# Patient Record
Sex: Female | Born: 2005
Health system: Southern US, Community
[De-identification: ages and names within clinical notes are randomized; demographics above are authoritative.]

## PROBLEM LIST (undated history)

## (undated) DIAGNOSIS — Z8709 Personal history of other diseases of the respiratory system: Secondary | ICD-10-CM

## (undated) DIAGNOSIS — K121 Other forms of stomatitis: Secondary | ICD-10-CM

## (undated) DIAGNOSIS — N390 Urinary tract infection, site not specified: Secondary | ICD-10-CM

## (undated) DIAGNOSIS — T7840XA Allergy, unspecified, initial encounter: Secondary | ICD-10-CM

## (undated) DIAGNOSIS — B070 Plantar wart: Secondary | ICD-10-CM

## (undated) HISTORY — DX: Other forms of stomatitis: K12.1

## (undated) HISTORY — PX: LACRIMAL TUBE INSERTION: SHX1905

## (undated) HISTORY — DX: Urinary tract infection, site not specified: N39.0

## (undated) HISTORY — DX: Allergy, unspecified, initial encounter: T78.40XA

## (undated) HISTORY — DX: Personal history of other diseases of the respiratory system: Z87.09

## (undated) HISTORY — DX: Plantar wart: B07.0

---
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
## 2024-01-04 ENCOUNTER — Other Ambulatory Visit (HOSPITAL_COMMUNITY): Payer: Self-pay

## 2024-02-07 ENCOUNTER — Other Ambulatory Visit: Payer: Self-pay | Admitting: Pediatrics

## 2024-02-07 NOTE — Progress Notes (Signed)
 Letter written for college

## 2024-02-21 ENCOUNTER — Other Ambulatory Visit: Payer: Self-pay

## 2024-02-21 ENCOUNTER — Other Ambulatory Visit: Payer: Self-pay | Admitting: Pediatrics

## 2024-02-22 ENCOUNTER — Other Ambulatory Visit (HOSPITAL_COMMUNITY): Payer: Self-pay

## 2024-02-22 MED ORDER — SERTRALINE HCL 100 MG PO TABS
100.0000 mg | ORAL_TABLET | Freq: Every day | ORAL | 2 refills | Status: AC
  Filled 2024-02-22 – 2024-03-09 (×2): qty 90, 90d supply, fill #0

## 2024-02-23 ENCOUNTER — Encounter (HOSPITAL_BASED_OUTPATIENT_CLINIC_OR_DEPARTMENT_OTHER): Payer: Self-pay

## 2024-02-23 ENCOUNTER — Other Ambulatory Visit: Payer: Self-pay

## 2024-02-23 DIAGNOSIS — Z882 Allergy status to sulfonamides status: Secondary | ICD-10-CM

## 2024-02-23 DIAGNOSIS — Z79899 Other long term (current) drug therapy: Secondary | ICD-10-CM

## 2024-02-23 DIAGNOSIS — K859 Acute pancreatitis without necrosis or infection, unspecified: Principal | ICD-10-CM | POA: Diagnosis present

## 2024-02-23 DIAGNOSIS — Z818 Family history of other mental and behavioral disorders: Secondary | ICD-10-CM

## 2024-02-23 DIAGNOSIS — Z833 Family history of diabetes mellitus: Secondary | ICD-10-CM

## 2024-02-23 DIAGNOSIS — Z793 Long term (current) use of hormonal contraceptives: Secondary | ICD-10-CM

## 2024-02-23 DIAGNOSIS — K219 Gastro-esophageal reflux disease without esophagitis: Secondary | ICD-10-CM | POA: Diagnosis present

## 2024-02-23 DIAGNOSIS — D75839 Thrombocytosis, unspecified: Secondary | ICD-10-CM | POA: Diagnosis present

## 2024-02-23 DIAGNOSIS — E282 Polycystic ovarian syndrome: Secondary | ICD-10-CM | POA: Diagnosis present

## 2024-02-23 DIAGNOSIS — Z8249 Family history of ischemic heart disease and other diseases of the circulatory system: Secondary | ICD-10-CM

## 2024-02-23 DIAGNOSIS — E781 Pure hyperglyceridemia: Secondary | ICD-10-CM | POA: Diagnosis present

## 2024-02-23 DIAGNOSIS — R6510 Systemic inflammatory response syndrome (SIRS) of non-infectious origin without acute organ dysfunction: Secondary | ICD-10-CM | POA: Diagnosis present

## 2024-02-23 LAB — URINALYSIS, ROUTINE W REFLEX MICROSCOPIC
Bilirubin Urine: NEGATIVE
Glucose, UA: NEGATIVE mg/dL
Ketones, ur: NEGATIVE mg/dL
Leukocytes,Ua: NEGATIVE
Nitrite: NEGATIVE
Specific Gravity, Urine: 1.027 (ref 1.005–1.030)
pH: 6.5 (ref 5.0–8.0)

## 2024-02-23 LAB — PREGNANCY, URINE: Preg Test, Ur: NEGATIVE

## 2024-02-23 NOTE — ED Triage Notes (Signed)
 Pt c/o abd pain, NV onset "earlier today, kept getting worse. Thought it was trapped gas or something- took gas pills/ pepto pills." L side/ flank pain.  Pt w familial hx of kidney stones

## 2024-02-24 ENCOUNTER — Emergency Department (HOSPITAL_BASED_OUTPATIENT_CLINIC_OR_DEPARTMENT_OTHER)

## 2024-02-24 ENCOUNTER — Inpatient Hospital Stay (HOSPITAL_BASED_OUTPATIENT_CLINIC_OR_DEPARTMENT_OTHER)
Admission: EM | Admit: 2024-02-24 | Discharge: 2024-02-28 | DRG: 439 | Disposition: A | Discharge status: Home / Self Care | Attending: Internal Medicine | Admitting: Internal Medicine

## 2024-02-24 DIAGNOSIS — R109 Unspecified abdominal pain: Secondary | ICD-10-CM | POA: Diagnosis not present

## 2024-02-24 DIAGNOSIS — K859 Acute pancreatitis without necrosis or infection, unspecified: Secondary | ICD-10-CM

## 2024-02-24 HISTORY — DX: Acute pancreatitis without necrosis or infection, unspecified: K85.90

## 2024-02-24 LAB — COMPREHENSIVE METABOLIC PANEL WITH GFR
ALT: 19 U/L (ref 0–44)
AST: 19 U/L (ref 15–41)
Albumin: 4.1 g/dL (ref 3.5–5.0)
Alkaline Phosphatase: 90 U/L (ref 38–126)
BUN: 7 mg/dL (ref 6–20)
CO2: <7 mmol/L — ABNORMAL LOW (ref 22–32)
Calcium: 9.6 mg/dL (ref 8.9–10.3)
Chloride: 96 mmol/L — ABNORMAL LOW (ref 98–111)
Creatinine, Ser: 0.62 mg/dL (ref 0.44–1.00)
GFR, Estimated: >60 mL/min (ref >60)
Glucose, Bld: 115 mg/dL — ABNORMAL HIGH (ref 70–99)
Potassium: 4.1 mmol/L (ref 3.5–5.1)
Sodium: 133 mmol/L — ABNORMAL LOW (ref 135–145)
Total Bilirubin: <0.2 mg/dL (ref 0.0–1.2)
Total Protein: 8 g/dL (ref 6.5–8.1)

## 2024-02-24 LAB — LACTIC ACID, PLASMA
Lactic Acid, Venous: 1.6 mmol/L (ref 0.5–1.9)
Lactic Acid, Venous: 1.1 mmol/L (ref 0.5–1.9)

## 2024-02-24 LAB — I-STAT VENOUS BLOOD GAS, ED
Acid-base deficit: 3 mmol/L — ABNORMAL HIGH (ref 0.0–2.0)
Bicarbonate: 22.6 mmol/L (ref 20.0–28.0)
Calcium, Ion: 1.17 mmol/L (ref 1.15–1.40)
HCT: 31 % — ABNORMAL LOW (ref 36.0–46.0)
Hemoglobin: 10.5 g/dL — ABNORMAL LOW (ref 12.0–15.0)
O2 Saturation: 54 %
Potassium: 4 mmol/L (ref 3.5–5.1)
Sodium: 136 mmol/L (ref 135–145)
TCO2: 24 mmol/L (ref 22–32)
pCO2, Ven: 40.8 mmHg — ABNORMAL LOW (ref 44–60)
pH, Ven: 7.352 (ref 7.25–7.43)
pO2, Ven: 30 mmHg — CL (ref 32–45)

## 2024-02-24 LAB — CBC
HCT: 35.2 % — ABNORMAL LOW (ref 36.0–46.0)
HCT: 38.5 % (ref 36.0–46.0)
Hemoglobin: 12.1 g/dL (ref 12.0–15.0)
Hemoglobin: 13.1 g/dL (ref 12.0–15.0)
MCH: 28.4 pg (ref 26.0–34.0)
MCH: 28.8 pg (ref 26.0–34.0)
MCHC: 34 g/dL (ref 30.0–36.0)
MCHC: 34.4 g/dL (ref 30.0–36.0)
MCV: 83.5 fL (ref 80.0–100.0)
MCV: 83.8 fL (ref 80.0–100.0)
Platelets: 379 10*3/uL (ref 150–400)
Platelets: 430 10*3/uL — ABNORMAL HIGH (ref 150–400)
RBC: 4.2 MIL/uL (ref 3.87–5.11)
RBC: 4.61 MIL/uL (ref 3.87–5.11)
RDW: 12.6 % (ref 11.5–15.5)
RDW: 13.1 % (ref 11.5–15.5)
WBC: 18.2 10*3/uL — ABNORMAL HIGH (ref 4.0–10.5)
WBC: 21.4 10*3/uL — ABNORMAL HIGH (ref 4.0–10.5)
nRBC: 0 % (ref 0.0–0.2)
nRBC: 0 % (ref 0.0–0.2)

## 2024-02-24 LAB — HIV ANTIBODY (ROUTINE TESTING W REFLEX): HIV Screen 4th Generation wRfx: NONREACTIVE

## 2024-02-24 LAB — BASIC METABOLIC PANEL WITH GFR
Anion gap: 15 (ref 5–15)
BUN: 5 mg/dL — ABNORMAL LOW (ref 6–20)
CO2: 17 mmol/L — ABNORMAL LOW (ref 22–32)
Calcium: 7.9 mg/dL — ABNORMAL LOW (ref 8.9–10.3)
Chloride: 104 mmol/L (ref 98–111)
Creatinine, Ser: 0.42 mg/dL — ABNORMAL LOW (ref 0.44–1.00)
GFR, Estimated: >60 mL/min (ref >60)
Glucose, Bld: 107 mg/dL — ABNORMAL HIGH (ref 70–99)
Potassium: 3.6 mmol/L (ref 3.5–5.1)
Sodium: 135 mmol/L (ref 135–145)

## 2024-02-24 LAB — LIPID PANEL
Cholesterol: 243 mg/dL — ABNORMAL HIGH (ref 0–200)
HDL: 48 mg/dL (ref >40)
LDL Cholesterol: 117 mg/dL — ABNORMAL HIGH (ref 0–99)
Total CHOL/HDL Ratio: 5.1 ratio
Triglycerides: 391 mg/dL — ABNORMAL HIGH (ref <150)
VLDL: 78 mg/dL — ABNORMAL HIGH (ref 0–40)

## 2024-02-24 LAB — CREATININE, SERUM
Creatinine, Ser: 0.48 mg/dL (ref 0.44–1.00)
GFR, Estimated: >60 mL/min (ref >60)

## 2024-02-24 LAB — LIPASE, BLOOD: Lipase: 138 U/L — ABNORMAL HIGH (ref 11–51)

## 2024-02-24 LAB — HEMOGLOBIN A1C
Hgb A1c MFr Bld: 4.6 % — ABNORMAL LOW (ref 4.8–5.6)
Mean Plasma Glucose: 85.32 mg/dL

## 2024-02-24 MED ORDER — PROMETHAZINE HCL 25 MG/ML IJ SOLN
INTRAMUSCULAR | Status: AC
  Filled 2024-02-24: qty 1

## 2024-02-24 MED ORDER — KETOROLAC TROMETHAMINE 15 MG/ML IJ SOLN
15.0000 mg | Freq: Four times a day (QID) | INTRAMUSCULAR | Status: DC | PRN
  Administered 2024-02-24: 15 mg via INTRAVENOUS
  Filled 2024-02-24: qty 1

## 2024-02-24 MED ORDER — EPINEPHRINE 0.3 MG/0.3ML IJ SOAJ: 0.3000 mg | INTRAMUSCULAR | Status: DC | PRN

## 2024-02-24 MED ORDER — ALBUTEROL SULFATE (2.5 MG/3ML) 0.083% IN NEBU: 3.0000 mL | INHALATION_SOLUTION | Freq: Four times a day (QID) | RESPIRATORY_TRACT | Status: DC | PRN

## 2024-02-24 MED ORDER — HYDROMORPHONE HCL 1 MG/ML IJ SOLN
1.0000 mg | Freq: Once | INTRAMUSCULAR | Status: AC
  Administered 2024-02-24: 1 mg via INTRAVENOUS
  Filled 2024-02-24: qty 1

## 2024-02-24 MED ORDER — FENTANYL CITRATE PF 50 MCG/ML IJ SOSY
50.0000 ug | PREFILLED_SYRINGE | INTRAMUSCULAR | Status: AC | PRN
  Administered 2024-02-24 (×2): 50 ug via INTRAVENOUS
  Filled 2024-02-24 (×2): qty 1

## 2024-02-24 MED ORDER — SENNOSIDES-DOCUSATE SODIUM 8.6-50 MG PO TABS
1.0000 | ORAL_TABLET | Freq: Every evening | ORAL | Status: DC | PRN
  Administered 2024-02-24 – 2024-02-25 (×2): 1 via ORAL
  Filled 2024-02-24 (×2): qty 1

## 2024-02-24 MED ORDER — MORPHINE SULFATE (PF) 4 MG/ML IV SOLN
4.0000 mg | Freq: Once | INTRAVENOUS | Status: AC
  Administered 2024-02-24: 4 mg via INTRAVENOUS
  Filled 2024-02-24: qty 1

## 2024-02-24 MED ORDER — ONDANSETRON HCL 4 MG/2ML IJ SOLN
4.0000 mg | Freq: Once | INTRAMUSCULAR | Status: AC
  Administered 2024-02-24: 4 mg via INTRAVENOUS
  Filled 2024-02-24: qty 2

## 2024-02-24 MED ORDER — ACETAMINOPHEN 650 MG RE SUPP: 650.0000 mg | Freq: Four times a day (QID) | RECTAL | Status: DC | PRN

## 2024-02-24 MED ORDER — SODIUM CHLORIDE 0.9 % IV BOLUS
1000.0000 mL | Freq: Once | INTRAVENOUS | Status: AC
  Administered 2024-02-24: 1000 mL via INTRAVENOUS

## 2024-02-24 MED ORDER — HYDROCODONE-ACETAMINOPHEN 5-325 MG PO TABS
1.0000 | ORAL_TABLET | Freq: Four times a day (QID) | ORAL | Status: DC | PRN
  Administered 2024-02-24: 1 via ORAL
  Administered 2024-02-25 (×2): 2 via ORAL
  Administered 2024-02-25: 1 via ORAL
  Administered 2024-02-26 (×2): 2 via ORAL
  Administered 2024-02-26: 1 via ORAL
  Administered 2024-02-28: 2 via ORAL
  Filled 2024-02-24 (×3): qty 2
  Filled 2024-02-24: qty 1
  Filled 2024-02-24 (×3): qty 2
  Filled 2024-02-24: qty 1

## 2024-02-24 MED ORDER — HYDROMORPHONE HCL 1 MG/ML IJ SOLN
1.0000 mg | INTRAMUSCULAR | Status: DC | PRN
  Administered 2024-02-25 – 2024-02-27 (×8): 1 mg via INTRAVENOUS
  Filled 2024-02-24 (×9): qty 1

## 2024-02-24 MED ORDER — LACTATED RINGERS IV SOLN: INTRAVENOUS | Status: DC

## 2024-02-24 MED ORDER — IOHEXOL 300 MG/ML  SOLN
100.0000 mL | Freq: Once | INTRAMUSCULAR | Status: AC | PRN
  Administered 2024-02-24: 100 mL via INTRAVENOUS

## 2024-02-24 MED ORDER — MORPHINE SULFATE (PF) 2 MG/ML IV SOLN
2.0000 mg | INTRAVENOUS | Status: DC | PRN
  Administered 2024-02-24 (×2): 2 mg via INTRAVENOUS
  Filled 2024-02-24 (×2): qty 1

## 2024-02-24 MED ORDER — ACETAMINOPHEN 325 MG PO TABS
650.0000 mg | ORAL_TABLET | Freq: Four times a day (QID) | ORAL | Status: DC | PRN
  Filled 2024-02-24: qty 2

## 2024-02-24 MED ORDER — SODIUM CHLORIDE 0.9 % IV SOLN
12.5000 mg | Freq: Four times a day (QID) | INTRAVENOUS | Status: DC | PRN
  Administered 2024-02-24 – 2024-02-27 (×6): 12.5 mg via INTRAVENOUS
  Filled 2024-02-24 (×2): qty 0.5
  Filled 2024-02-24 (×2): qty 12.5
  Filled 2024-02-24: qty 0.5
  Filled 2024-02-24: qty 12.5

## 2024-02-24 MED ORDER — ENOXAPARIN SODIUM 40 MG/0.4ML IJ SOSY
40.0000 mg | PREFILLED_SYRINGE | INTRAMUSCULAR | Status: DC
  Administered 2024-02-24 – 2024-02-26 (×3): 40 mg via SUBCUTANEOUS
  Filled 2024-02-24 (×3): qty 0.4

## 2024-02-24 MED ORDER — ONDANSETRON HCL 4 MG/2ML IJ SOLN
4.0000 mg | Freq: Three times a day (TID) | INTRAMUSCULAR | Status: DC | PRN
  Administered 2024-02-24 – 2024-02-27 (×6): 4 mg via INTRAVENOUS
  Filled 2024-02-24 (×9): qty 2

## 2024-02-24 MED ORDER — HYDROCODONE-ACETAMINOPHEN 5-325 MG PO TABS: 1.0000 | ORAL_TABLET | ORAL | Status: DC | PRN

## 2024-02-24 NOTE — ED Notes (Signed)
 [facility] at bedside to transport pt to [facility]

## 2024-02-24 NOTE — H&P (Signed)
 [facility] H&P   Patient Demographics:    Judy Hughes, is a 18 y.o. female  MRN: [id number]   DOB - 11/30/2005  Admit Date - 02/24/2024  Outpatient Primary MD for the patient is Klett, Freya Jesus, NP  Patient coming from: [facility]  Chief Complaint  Patient presents with   Abdominal Pain   Flank Pain    L      HPI:    Judy Hughes  is a 18 y.o. female, with history of PCOS, seasonal allergies, UTIs, migraine, lipidemia for which she has not taken a medication in few years comes to the hospital after experiencing sharp epigastric abdominal pain which started abruptly yesterday at home, pain is radiating to her back and associated with nausea and vomiting, worse with eating food better with bowel rest and pain medications, went to [facility] where workup was suggestive of acute pancreatitis and she was sent to [facility] for further admission.  Her LFTs were stable, she had reactive leukocytosis, CT scan confirmed pancreatitis with no CBD dilation or gallstones.  Patient denies any previous history of pancreatitis, occasional minimal amounts of alcohol intake, she was diagnosed with cholesterol but has not taken a medication for it for 2 years, she still has her gallbladder but no history of gallstones, no exposure to weight loss medications, one of her cousins sisters also had pancreatitis a few months ago, she denies any fever or chills, no headache, no chest pain, no blood in stool or urine, no dysuria, no focal weakness.    Review of systems:     A full 10 point Review of Systems was done, except as stated above, all other Review of Systems were negative.   With Past History of the following :    Past Medical History:   Diagnosis Date   Allergy    Gastroesophageal reflux disease without esophagitis 12/15/2016   History of frequent upper respiratory infection 2017   Migraine without aura and without status migrainosus, not intractable 07/02/2020   Migraine, unspecified, not intractable, without status migrainosus 08/20/2021   Mouth ulcer    PCOS (polycystic ovarian syndrome) 02/04/2020   Plantar wart    Positive RAST testing 08/02/2012   Urinary tract infection    x3 as of 6/12   Verruca vulgaris 05/30/2020      Past Surgical History:  Procedure Laterality Date   LACRIMAL TUBE INSERTION        Social History:     Social History   Tobacco Use   Smoking status: Never    Passive exposure: Current   Smokeless tobacco: Never   Tobacco comments:    second hand smoke exposure  Substance Use Topics   Alcohol use: No      Family History :     Family History  Problem Relation Age of Onset   Other Mother        +anticardiolipin antibody   Migraines Mother    Cholelithiasis Father    Depression Sister    Depression Brother    Bipolar disorder Brother    Other Brother        chronic back pain   Diabetes Maternal Aunt    Diabetes Paternal Uncle    Heart disease Maternal Grandmother        MI, CAD, died at 72, new MI   Cancer Maternal Grandfather 71       colorectal   Diabetes Maternal Grandfather    Celiac disease Neg Hx    Stroke Neg Hx        Home Medications:   Prior to Admission medications   Medication Sig Start Date End Date Taking? Authorizing Provider  albuterol  (VENTOLIN  HFA) 108 (90 Base) MCG/ACT inhaler Inhale 2 puffs into the lungs every 6 (six) hours as needed for wheezing or shortness of breath. Patient not taking: Reported on 09/27/2023 12/31/22   Tysinger, Christiane Cowing, PA-C  amphetamine -dextroamphetamine  (ADDERALL XR) 10 MG 24 hr capsule Take 1 capsule (10 mg total) by mouth daily. 06/10/23 09/12/23  Rayann Cage, NP  Atogepant  (QULIPTA ) 60 MG TABS Take 1 tablet (60  mg total) by mouth daily. 02/27/23   Glory Larsen, MD  benzonatate  (TESSALON ) 100 MG capsule Take 1-2 caps PO TID PRN 10/08/23   Mayers, Cari S, PA-C  cetirizine  (ZYRTEC ) 10 MG chewable tablet Chew 10 mg by mouth daily.    [provider]  desogestrel -ethinyl estradiol  (ISIBLOOM ) 0.15-30 MG-MCG tablet Take 1 tablet by mouth daily. (continuous) 10/03/23 03/29/24  Klett, Freya Jesus, NP  EPINEPHrine  0.3 mg/0.3 mL IJ SOAJ injection Inject 0.3 mg into the muscle as needed for anaphylaxis. Patient not taking: Reported on 09/27/2023 05/11/23   Rayann Cage, NP  fluconazole  (DIFLUCAN ) 150 MG tablet Take 1 tablet (150 mg total) by mouth once if symptoms and can repeat 1 tablet in 1 week if needed 09/27/23   Tysinger, Christiane Cowing, PA-C  ibuprofen  (ADVIL ) 200 MG tablet Take 800 mg by mouth every 6 (six) hours as needed.    [provider]  montelukast  (SINGULAIR ) 10 MG tablet Take 1 tablet (10 mg total) by mouth at bedtime. Patient not taking: Reported on 09/27/2023 08/10/22   Crain, Whitney L, PA  Multiple Vitamin (MULTIVITAMIN PO) Take by mouth.    [provider]  ondansetron  (ZOFRAN -ODT) 4 MG disintegrating tablet Dissolve 1 tablet (4 mg total) in mouth every 8 (eight) hours as needed for nausea or vomiting. 11/14/23   Klett, Freya Jesus, NP  rizatriptan  (MAXALT -MLT) 10 MG disintegrating tablet Take 1 tablet (10 mg total) by mouth as needed for migraine. May repeat in 2 hours if needed Patient not taking: Reported on 09/27/2023 02/24/23   Glory Larsen, MD  sertraline  (ZOLOFT ) 100 MG tablet Take 1 tablet (100 mg total) by mouth daily. 02/22/24   Rayann Cage, NP  Allergies:     Allergies  Allergen Reactions   Sulfa Antibiotics Rash     Physical Exam:   Vitals  Blood pressure 133/86, pulse (!) 114, temperature 98.5 F (36.9 C), temperature source Oral, resp. rate 15, SpO2 99%.   1. General -Young, obese Caucasian girl lying in hospital bed in mild distress due to abdominal pain,  2.  Normal affect and insight, Not Suicidal or Homicidal, Awake Alert,   3. No F.N deficits, ALL C.Nerves Intact, Strength 5/5 all 4 extremities, Sensation intact all 4 extremities, Plantars down going.  4. Ears and Eyes appear Normal, Conjunctivae clear, PERRLA. Moist Oral Mucosa.  5. Supple Neck, No JVD, No cervical lymphadenopathy appriciated, No Carotid Bruits.  6. Symmetrical Chest wall movement, Good air movement bilaterally, CTAB.  7. RRR, No Gallops, Rubs or Murmurs, No Parasternal Heave.  8. Positive Bowel Sounds, Abdomen Soft, positive epigastric tenderness, No organomegaly appriciated,No rebound -guarding or rigidity.  9.  No Cyanosis, Normal Skin Turgor, No Skin Rash or Bruise.  10. Good muscle tone,  joints appear normal , no effusions, Normal ROM.  11. No Palpable Lymph Nodes in Neck or Axillae      Data Review:   Recent Labs  Lab 02/24/24 0040 02/24/24 0515  WBC 21.4*  --   HGB 13.1 10.5*  HCT 38.5 31.0*  PLT 430*  --   MCV 83.5  --   MCH 28.4  --   MCHC 34.0  --   RDW 12.6  --     Recent Labs  Lab 02/24/24 0040 02/24/24 0446 02/24/24 0515 02/24/24 0628  NA 133*  --  136 135  K 4.1  --  4.0 3.6  CL 96*  --   --  104  CO2 <7*  --   --  17*  ANIONGAP NOT CALCULATED  --   --  15  GLUCOSE 115*  --   --  107*  BUN 7  --   --  5*  CREATININE 0.62  --   --  0.42*  AST 19  --   --   --   ALT 19  --   --   --   ALKPHOS 90  --   --   --   BILITOT <0.2  --   --   --   ALBUMIN 4.1  --   --   --   LATICACIDVEN  --  1.6  --  1.1  CALCIUM 9.6  --   --  7.9*    Lab Results  Component Value Date   CHOL 184 (H) 04/20/2021   HDL 62 04/20/2021   LDLCALC 81 04/20/2021   TRIG 343 (H) 04/20/2021   CHOLHDL 3.0 04/20/2021    Recent Labs  Lab 02/24/24 0040 02/24/24 0446 02/24/24 0628  LATICACIDVEN  --  1.6 1.1  CALCIUM 9.6  --  7.9*    Recent Labs  Lab 02/24/24 0040 02/24/24 0446 02/24/24 0628  WBC 21.4*  --   --   PLT 430*  --   --    LATICACIDVEN  --  1.6 1.1  CREATININE 0.62  --  0.42*    Urinalysis    Component Value Date/Time   COLORURINE YELLOW 02/23/2024 2150   APPEARANCEUR CLEAR 02/23/2024 2150   LABSPEC 1.027 02/23/2024 2150   LABSPEC 1.020 04/19/2017 0959   PHURINE 6.5 02/23/2024 2150   GLUCOSEU NEGATIVE 02/23/2024 2150   HGBUR TRACE (A) 02/23/2024 2150   BILIRUBINUR NEGATIVE 02/23/2024 2150  BILIRUBINUR negative 04/19/2017 0959   BILIRUBINUR n 04/15/2016 1121   KETONESUR NEGATIVE 02/23/2024 2150   PROTEINUR TRACE (A) 02/23/2024 2150   UROBILINOGEN negative 04/15/2016 1121   UROBILINOGEN 0.2 07/10/2012 1459   NITRITE NEGATIVE 02/23/2024 2150   LEUKOCYTESUR NEGATIVE 02/23/2024 2150      Imaging Results:    CT ABDOMEN PELVIS W CONTRAST Result Date: 02/24/2024 EXAM: CT ABDOMEN AND PELVIS WITH CONTRAST 02/24/2024 01:41:04 AM TECHNIQUE: CT of the abdomen and pelvis was performed with the administration of intravenous contrast. Multiplanar reformatted images are provided for review. Automated exposure control, iterative reconstruction, and/or weight based adjustment of the mA/kV was utilized to reduce the radiation dose to as low as reasonably achievable. COMPARISON: None available. CLINICAL HISTORY: Abdominal pain, acute, nonlocalized. Pt c/o abd pain, NV onset "earlier today, kept getting worse. Thought it was trapped gas or something- took gas pills/ pepto pills." L side/ flank pain. Pt w familial hx of kidney stones. FINDINGS: LOWER CHEST: No acute abnormality. LIVER: The liver is unremarkable. GALLBLADDER AND BILE DUCTS: Gallbladder is unremarkable. No biliary ductal dilatation. SPLEEN: No acute abnormality. PANCREAS: Peripancreatic fluid/inflammatory changes along the pancreatic tail image 27, suggesting acute pancreatitis. No walled-off fluid collection/pseudocyst. ADRENAL GLANDS: No acute abnormality. KIDNEYS, URETERS AND BLADDER: No stones in the kidneys or ureters. No hydronephrosis. No perinephric or  periureteral stranding. Urinary bladder is unremarkable. GI AND BOWEL: Stomach demonstrates no acute abnormality. There is no bowel obstruction. No appendicitis. Normal appendix (image 70). PERITONEUM AND RETROPERITONEUM: No ascites. No free air. VASCULATURE: Aorta is normal in caliber. LYMPH NODES: No lymphadenopathy. REPRODUCTIVE ORGANS: No acute abnormality. BONES AND SOFT TISSUES: No acute osseous abnormality. No focal soft tissue abnormality. IMPRESSION: 1. Acute pancreatitis, as above. No walled-off fluid collection/pseudocyst. Electronically signed by: Zadie Herter MD 02/24/2024 01:44 AM EDT RP Workstation: [id number]     Assessment & Plan:   Acute pancreatitis.  Unclear etiology, LFTs stable, CBD looks unremarkable on CT, no history of alcohol abuse, does have history of high cholesterol hence we will check a lipid panel, and IgG4, for now admitted with bowel rest, IV fluids and pain control.   She does take oral contraceptive pills and could be a potential trigger.  2.  Leukocytosis.  Reactive.  Monitor.  3.  History of allergies.  Supportive care.  4.  Dyslipidemia.  Check lipid panel.  Does not take any medications.   Home medication list is wrong, according to the patient she is only taking 3 medications 1 of which is an oral contraceptive, pharmacy to update.   DVT Prophylaxis Lovenox  AM Labs Ordered, also please review Full Orders  Family Communication: Admission, patients condition and plan of care including tests being ordered have been discussed with the patient and mother who indicate understanding and agree with the plan and Code Status.  Code Status full code  Likely DC to home  Condition fair  Consults called: None  Admission status: Observation  Time spent in minutes : 45  Signature  -    Lynnwood Sauer M.D on 02/24/2024 at 4:46 PM   -  To page go to [email or web address]

## 2024-02-24 NOTE — ED Provider Notes (Signed)
 [place] EMERGENCY DEPARTMENT AT [facility] Provider Note   CSN: [id number] Arrival date & time: 02/23/24  2132     History  Chief Complaint  Patient presents with   Abdominal Pain   Flank Pain    L    Judy Hughes is a 18 y.o. female.  Patient is a 18 year old female presenting with complaints of abdominal pain.  She describes nausea and vomiting earlier today along with left-sided abdominal pain.  This seems to have worsened throughout the day.  No fevers or chills.  She denies any diarrhea or constipation.  No urinary complaints.  No aggravating or alleviating factors.       Home Medications Prior to Admission medications   Medication Sig Start Date End Date Taking? Authorizing Provider  albuterol  (VENTOLIN  HFA) 108 (90 Base) MCG/ACT inhaler Inhale 2 puffs into the lungs every 6 (six) hours as needed for wheezing or shortness of breath. Patient not taking: Reported on 09/27/2023 12/31/22   Tysinger, Christiane Cowing, PA-C  amphetamine -dextroamphetamine  (ADDERALL XR) 10 MG 24 hr capsule Take 1 capsule (10 mg total) by mouth daily. 06/10/23 09/12/23  Rayann Cage, NP  Atogepant  (QULIPTA ) 60 MG TABS Take 1 tablet (60 mg total) by mouth daily. 02/27/23   Glory Larsen, MD  benzonatate  (TESSALON ) 100 MG capsule Take 1-2 caps PO TID PRN 10/08/23   Mayers, Cari S, PA-C  cetirizine  (ZYRTEC ) 10 MG chewable tablet Chew 10 mg by mouth daily.    [provider]  desogestrel -ethinyl estradiol  (ISIBLOOM ) 0.15-30 MG-MCG tablet Take 1 tablet by mouth daily. (continuous) 10/03/23 03/29/24  Klett, Freya Jesus, NP  EPINEPHrine  0.3 mg/0.3 mL IJ SOAJ injection Inject 0.3 mg into the muscle as needed for anaphylaxis. Patient not taking: Reported on 09/27/2023 05/11/23   Rayann Cage, NP  fluconazole  (DIFLUCAN ) 150 MG tablet Take 1 tablet (150 mg total) by mouth once if symptoms and can repeat 1 tablet in 1 week if needed 09/27/23   Tysinger, Christiane Cowing, PA-C  ibuprofen  (ADVIL ) 200 MG tablet Take 800 mg  by mouth every 6 (six) hours as needed.    [provider]  montelukast  (SINGULAIR ) 10 MG tablet Take 1 tablet (10 mg total) by mouth at bedtime. Patient not taking: Reported on 09/27/2023 08/10/22   Crain, Whitney L, PA  Multiple Vitamin (MULTIVITAMIN PO) Take by mouth.    [provider]  ondansetron  (ZOFRAN -ODT) 4 MG disintegrating tablet Dissolve 1 tablet (4 mg total) in mouth every 8 (eight) hours as needed for nausea or vomiting. 11/14/23   Klett, Freya Jesus, NP  rizatriptan  (MAXALT -MLT) 10 MG disintegrating tablet Take 1 tablet (10 mg total) by mouth as needed for migraine. May repeat in 2 hours if needed Patient not taking: Reported on 09/27/2023 02/24/23   Glory Larsen, MD  sertraline  (ZOLOFT ) 100 MG tablet Take 1 tablet (100 mg total) by mouth daily. 02/22/24   Rayann Cage, NP      Allergies    Sulfa antibiotics    Review of Systems   Review of Systems  All other systems reviewed and are negative.   Physical Exam Updated Vital Signs BP 120/89   Pulse 92   Temp 98.6 F (37 C)   Resp 18   SpO2 98%  Physical Exam Vitals and nursing note reviewed.  Constitutional:      General: She is not in acute distress.    Appearance: She is well-developed. She is not diaphoretic.  HENT:  Head: Normocephalic and atraumatic.  Cardiovascular:     Rate and Rhythm: Normal rate and regular rhythm.     Heart sounds: No murmur heard.    No friction rub. No gallop.  Pulmonary:     Effort: Pulmonary effort is normal. No respiratory distress.     Breath sounds: Normal breath sounds. No wheezing.  Abdominal:     General: Bowel sounds are normal. There is no distension.     Palpations: Abdomen is soft.     Tenderness: There is generalized abdominal tenderness. There is no right CVA tenderness, left CVA tenderness, guarding or rebound.  Musculoskeletal:        General: Normal range of motion.     Cervical back: Normal range of motion and neck supple.  Skin:    General: Skin  is warm and dry.  Neurological:     General: No focal deficit present.     Mental Status: She is alert and oriented to person, place, and time.     ED Results / Procedures / Treatments   Labs (all labs ordered are listed, but only abnormal results are displayed) Labs Reviewed  URINALYSIS, ROUTINE W REFLEX MICROSCOPIC - Abnormal; Notable for the following components:      Result Value   Hgb urine dipstick TRACE (*)    Protein, ur TRACE (*)    Bacteria, UA RARE (*)    All other components within normal limits  CBC - Abnormal; Notable for the following components:   WBC 21.4 (*)    Platelets 430 (*)    All other components within normal limits  COMPREHENSIVE METABOLIC PANEL WITH GFR - Abnormal; Notable for the following components:   Sodium 133 (*)    Chloride 96 (*)    CO2 <7 (*)    Glucose, Bld 115 (*)    All other components within normal limits  PREGNANCY, URINE    EKG None  Radiology No results found.  Procedures Procedures    Medications Ordered in ED Medications  fentaNYL (SUBLIMAZE) injection 50 mcg (50 mcg Intravenous Given 02/24/24 0046)  sodium chloride 0.9 % bolus 1,000 mL (has no administration in time range)  iohexol (OMNIPAQUE) 300 MG/ML solution 100 mL (has no administration in time range)  ondansetron  (ZOFRAN ) injection 4 mg (4 mg Intravenous Given 02/24/24 0045)  morphine (PF) 4 MG/ML injection 4 mg (4 mg Intravenous Given 02/24/24 0127)  ondansetron  (ZOFRAN ) injection 4 mg (4 mg Intravenous Given 02/24/24 0126)    ED Course/ Medical Decision Making/ A&P  Patient is a 18 year old female presenting with left upper quadrant pain as described in the HPI.  Patient arrives here with stable vital signs and is afebrile.  Physical examination reveals left upper quadrant tenderness, but is otherwise unremarkable.  Laboratory studies obtained including CBC, CMP, and lipase.  She has a white count of 21.4 and lipase of 138.  She also has a CO2 less than 7, but normal  blood sugar.  Urinalysis is clear and pregnancy test is negative.  CT scan of the abdomen and pelvis obtained showing acute pancreatitis.  Patient has received multiple pain medications including fentanyl, morphine x 2, and Dilaudid as well as Zofran  and Phenergan  for nausea with minimal relief of her discomfort.  Due to the intractable nature of her discomfort and nausea, I feel as though admission is appropriate.  Care discussed with Dr. Michell Ahumada who agrees to admit.  Also of note is that a lactic acid was obtained and was normal.  A VBG was also obtained showing a nearly normal pH and PCO2 of 24.  I am uncertain as to the initial CO2 level less than 7, but suspect this may have been erroneous.  Final Clinical Impression(s) / ED Diagnoses Final diagnoses:  None    Rx / DC Orders ED Discharge Orders     None         Orvilla Blander, MD 02/24/24 [phone]

## 2024-02-24 NOTE — Plan of Care (Signed)
 Plan of Care Note for accepted transfer   Patient name: Judy Hughes ZOX:[id number] DOB: [birth year]  Facility requesting transfer: [facility] ED Requesting Provider: Dr. Lula Sale Facility course: 18 year old female with history of GERD, migraine headaches, PCOS, UTI presented with complaints of acute onset abdominal pain, nausea, and vomiting.  Labs showing WBC count 21.4, sodium 133, chloride 96, bicarb <7, lipase 138.  CT showing acute pancreatitis without walled off fluid collection/pseudocyst.  Patient was given morphine, Zofran , Phenergan , fentanyl, and 1 L IV fluids.  Requested ED physician to check lactate, blood gas, and order bicarb replacement.  Plan of care: The patient is accepted for admission to Progressive unit at [facility].  [facility] will assume care on arrival to accepting facility. Until arrival, care as per EDP. However, [facility] available 24/7 for questions and assistance.  Check [email or web address] for on-call coverage.  Nursing staff, please call [facility] Admits & Consults System-Wide number under Amion on patient's arrival so appropriate admitting provider can evaluate the pt.

## 2024-02-24 NOTE — ED Notes (Signed)
 Judy Hughes with cl called for transport

## 2024-02-24 NOTE — ED Notes (Signed)
 ED Provider at bedside.

## 2024-02-24 NOTE — ED Notes (Signed)
 Attempted to call report. Nurse to call back.

## 2024-02-25 ENCOUNTER — Inpatient Hospital Stay (HOSPITAL_COMMUNITY)

## 2024-02-25 DIAGNOSIS — K859 Acute pancreatitis without necrosis or infection, unspecified: Secondary | ICD-10-CM | POA: Diagnosis not present

## 2024-02-25 DIAGNOSIS — Z818 Family history of other mental and behavioral disorders: Secondary | ICD-10-CM | POA: Diagnosis not present

## 2024-02-25 DIAGNOSIS — Z8249 Family history of ischemic heart disease and other diseases of the circulatory system: Secondary | ICD-10-CM | POA: Diagnosis not present

## 2024-02-25 DIAGNOSIS — K219 Gastro-esophageal reflux disease without esophagitis: Secondary | ICD-10-CM | POA: Diagnosis not present

## 2024-02-25 DIAGNOSIS — Z833 Family history of diabetes mellitus: Secondary | ICD-10-CM | POA: Diagnosis not present

## 2024-02-25 DIAGNOSIS — R6510 Systemic inflammatory response syndrome (SIRS) of non-infectious origin without acute organ dysfunction: Secondary | ICD-10-CM

## 2024-02-25 DIAGNOSIS — R918 Other nonspecific abnormal finding of lung field: Secondary | ICD-10-CM | POA: Diagnosis not present

## 2024-02-25 DIAGNOSIS — J90 Pleural effusion, not elsewhere classified: Secondary | ICD-10-CM | POA: Diagnosis not present

## 2024-02-25 DIAGNOSIS — D75839 Thrombocytosis, unspecified: Secondary | ICD-10-CM | POA: Diagnosis not present

## 2024-02-25 DIAGNOSIS — Z7722 Contact with and (suspected) exposure to environmental tobacco smoke (acute) (chronic): Secondary | ICD-10-CM | POA: Diagnosis not present

## 2024-02-25 DIAGNOSIS — Z79899 Other long term (current) drug therapy: Secondary | ICD-10-CM | POA: Diagnosis not present

## 2024-02-25 DIAGNOSIS — E282 Polycystic ovarian syndrome: Secondary | ICD-10-CM | POA: Diagnosis not present

## 2024-02-25 DIAGNOSIS — Z882 Allergy status to sulfonamides status: Secondary | ICD-10-CM | POA: Diagnosis not present

## 2024-02-25 DIAGNOSIS — R079 Chest pain, unspecified: Secondary | ICD-10-CM | POA: Diagnosis not present

## 2024-02-25 DIAGNOSIS — E781 Pure hyperglyceridemia: Secondary | ICD-10-CM | POA: Diagnosis not present

## 2024-02-25 LAB — CBC WITH DIFFERENTIAL/PLATELET
Abs Immature Granulocytes: 0.07 10*3/uL (ref 0.00–0.07)
Basophils Absolute: 0.1 10*3/uL (ref 0.0–0.1)
Basophils Relative: 0 %
Eosinophils Absolute: 0.1 10*3/uL (ref 0.0–0.5)
Eosinophils Relative: 1 %
HCT: 31.6 % — ABNORMAL LOW (ref 36.0–46.0)
Hemoglobin: 10.5 g/dL — ABNORMAL LOW (ref 12.0–15.0)
Immature Granulocytes: 0 %
Lymphocytes Relative: 13 %
Lymphs Abs: 2.1 10*3/uL (ref 0.7–4.0)
MCH: 28.4 pg (ref 26.0–34.0)
MCHC: 33.2 g/dL (ref 30.0–36.0)
MCV: 85.4 fL (ref 80.0–100.0)
Monocytes Absolute: 1 10*3/uL (ref 0.1–1.0)
Monocytes Relative: 6 %
Neutro Abs: 12.8 10*3/uL — ABNORMAL HIGH (ref 1.7–7.7)
Neutrophils Relative %: 80 %
Platelets: 326 10*3/uL (ref 150–400)
RBC: 3.7 MIL/uL — ABNORMAL LOW (ref 3.87–5.11)
RDW: 13.2 % (ref 11.5–15.5)
WBC: 16.1 10*3/uL — ABNORMAL HIGH (ref 4.0–10.5)
nRBC: 0 % (ref 0.0–0.2)

## 2024-02-25 LAB — TROPONIN I (HIGH SENSITIVITY): Troponin I (High Sensitivity): 4 ng/L (ref <18)

## 2024-02-25 LAB — COMPREHENSIVE METABOLIC PANEL WITH GFR
ALT: 14 U/L (ref 0–44)
AST: 12 U/L — ABNORMAL LOW (ref 15–41)
Albumin: 2.4 g/dL — ABNORMAL LOW (ref 3.5–5.0)
Alkaline Phosphatase: 49 U/L (ref 38–126)
Anion gap: 9 (ref 5–15)
BUN: 5 mg/dL — ABNORMAL LOW (ref 6–20)
CO2: 22 mmol/L (ref 22–32)
Calcium: 8.6 mg/dL — ABNORMAL LOW (ref 8.9–10.3)
Chloride: 103 mmol/L (ref 98–111)
Creatinine, Ser: 0.61 mg/dL (ref 0.44–1.00)
GFR, Estimated: >60 mL/min (ref >60)
Glucose, Bld: 92 mg/dL (ref 70–99)
Potassium: 4.1 mmol/L (ref 3.5–5.1)
Sodium: 134 mmol/L — ABNORMAL LOW (ref 135–145)
Total Bilirubin: 0.9 mg/dL (ref 0.0–1.2)
Total Protein: 5.9 g/dL — ABNORMAL LOW (ref 6.5–8.1)

## 2024-02-25 LAB — MAGNESIUM: Magnesium: 1.7 mg/dL (ref 1.7–2.4)

## 2024-02-25 LAB — PHOSPHORUS: Phosphorus: 3.4 mg/dL (ref 2.5–4.6)

## 2024-02-25 LAB — C-REACTIVE PROTEIN: CRP: 13.9 mg/dL — ABNORMAL HIGH (ref <1.0)

## 2024-02-25 LAB — PROTIME-INR
INR: 1.1 (ref 0.8–1.2)
Prothrombin Time: 14.4 s (ref 11.4–15.2)

## 2024-02-25 LAB — LACTIC ACID, PLASMA: Lactic Acid, Venous: 1.4 mmol/L (ref 0.5–1.9)

## 2024-02-25 MED ORDER — FENOFIBRATE 160 MG PO TABS
160.0000 mg | ORAL_TABLET | Freq: Every day | ORAL | Status: DC
  Administered 2024-02-25 – 2024-02-28 (×4): 160 mg via ORAL
  Filled 2024-02-25 (×4): qty 1

## 2024-02-25 MED ORDER — MORPHINE SULFATE (PF) 2 MG/ML IV SOLN: 2.0000 mg | INTRAVENOUS | Status: DC | PRN

## 2024-02-25 MED ORDER — ROSUVASTATIN CALCIUM 20 MG PO TABS
20.0000 mg | ORAL_TABLET | Freq: Every day | ORAL | Status: DC
  Administered 2024-02-25 – 2024-02-28 (×4): 20 mg via ORAL
  Filled 2024-02-25 (×5): qty 1

## 2024-02-25 MED ORDER — LACTATED RINGERS IV SOLN: INTRAVENOUS | Status: AC

## 2024-02-25 MED ORDER — PANTOPRAZOLE SODIUM 40 MG PO TBEC
40.0000 mg | DELAYED_RELEASE_TABLET | Freq: Every day | ORAL | Status: DC
  Administered 2024-02-25 – 2024-02-28 (×4): 40 mg via ORAL
  Filled 2024-02-25 (×4): qty 1

## 2024-02-25 NOTE — Consult Note (Addendum)
 Consultation  Referring Provider: [facility]/ Zelda Hickman Primary Care Physician:  Rayann Cage, NP Primary Gastroenterologist:  unassigned.  Reason for Consultation:  acute pancreatitis with SIRS  HPI: Josslin Wollenberg is a 18 y.o. female, generally healthy with history of PCOS and migraines.  Patient had been in her usual state of health until she developed acute onset of nausea vomiting and upper abdominal pain on 02/23/2024.  No associated fever or chills, no diarrhea.  Pain was constant and unrelenting and therefore came to the emergency room.  Workup with CT of the abdomen and pelvis done with contrast showed unremarkable gallbladder no ductal dilation, there was peripancreatic fluid and inflammatory changes along the pancreatic tail suggesting acute pancreatitis, no walled off fluid collection/pseudocyst otherwise negative  Labs on 02/24/2024 WBC 21.4/hemoglobin 13/hematocrit 38/lipase 138 Sodium 133/potassium 4.1/glucose 115 BUN 7/creatinine 0.62 Albumin 4.1 LFTs normal Lactate 1.6 IgG4 pending Triglycerides 391  Labs today WBC 16.1/hemoglobin 10.5/hematocrit 31.6/MCV 85 Potassium 4.1/BUN 5/creatinine 0.61 LFTs remain normal INR 1.1  Patient has been taking birth control pills long-term no recent changes in matter dosage, she not taking any other regular prescription medications, previously had prescription for Qulipta  and Maxalt  but has not been taking either for several months.  She denies any use of over-the-counter supplements, weight loss medications etc. No regular EtOH use. She does mention that she has a cousin in her early 65s who had a recent episode of pancreatitis secondary to hypertriglyceridemia.  Other family members that they are aware of with history of pancreatitis. Patient's father did have cholelithiasis and cholecystectomy.  Patient says her pain has improved since initial onset when it was a 10 out of 10 currently down to a 5-6 out of 10 but still requiring IV pain  medication.  Her nausea and vomiting has resolved and she does feel that she could keep some liquids down.  She says it hurts to move and therefore has not been getting up walking etc. No bowel movement over the past 2 days, no flatus      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 Authorizing Provider  albuterol  (VENTOLIN  HFA) 108 (90 Base) MCG/ACT inhaler Inhale 2 puffs into the lungs every 6 (six) hours as needed for wheezing or shortness of breath. 12/31/22  Yes Tysinger, Christiane Cowing, PA-C  cetirizine  (ZYRTEC ) 10 MG chewable tablet Chew 10 mg by mouth daily.   Yes [provider]  desogestrel -ethinyl estradiol  (ISIBLOOM ) 0.15-30 MG-MCG tablet Take 1 tablet by mouth daily. (continuous) Patient taking differently: Take 1 tablet by mouth every evening. 10/03/23 03/29/24 Yes Klett, Freya Jesus, NP  ibuprofen  (ADVIL ) 200 MG tablet Take 800 mg by mouth every 6 (six) hours as needed for mild pain (pain score 1-3).   Yes [provider]  Multiple Vitamin (MULTIVITAMIN PO) Take 1 tablet by mouth daily.  Yes [provider]  ondansetron  (ZOFRAN -ODT) 4 MG disintegrating tablet Dissolve 1 tablet (4 mg total) in mouth every 8 (eight) hours as needed for nausea or vomiting. 11/14/23  Yes Klett, Freya Jesus, NP  sertraline  (ZOLOFT ) 100 MG tablet Take 1  tablet (100 mg total) by mouth daily. 02/22/24  Yes Klett, Freya Jesus, NP  amphetamine -dextroamphetamine  (ADDERALL XR) 10 MG 24 hr capsule Take 1 capsule (10 mg total) by mouth daily. 06/10/23 09/12/23  Rayann Cage, NP  Atogepant  (QULIPTA ) 60 MG TABS Take 1 tablet (60 mg total) by mouth daily. Patient not taking: Reported on 02/24/2024 02/27/23   Glory Larsen, MD  EPINEPHrine  0.3 mg/0.3 mL IJ SOAJ injection Inject 0.3 mg into the muscle as needed for anaphylaxis. Patient not taking: Reported on 09/27/2023 05/11/23   Rayann Cage, NP  fluconazole  (DIFLUCAN ) 150 MG tablet Take 1 tablet (150 mg total) by mouth once if symptoms and can repeat 1 tablet in 1 week if needed Patient not taking: Reported on 02/24/2024 09/27/23   Tysinger, Christiane Cowing, PA-C  rizatriptan  (MAXALT -MLT) 10 MG disintegrating tablet Take 1 tablet (10 mg total) by mouth as needed for migraine. May repeat in 2 hours if needed Patient not taking: Reported on 09/27/2023 02/24/23   Glory Larsen, MD    Current Facility-Administered Medications  Medication Dose Route Frequency Provider Last Rate Last Admin   acetaminophen  (TYLENOL ) tablet 650 mg  650 mg Oral Q6H PRN Singh, Prashant K, MD       Or   acetaminophen  (TYLENOL ) suppository 650 mg  650 mg Rectal Q6H PRN Singh, Prashant K, MD       albuterol  (PROVENTIL ) (2.5 MG/3ML) 0.083% nebulizer solution 3 mL  3 mL Inhalation Q6H PRN Singh, Prashant K, MD       enoxaparin  (LOVENOX ) injection 40 mg  40 mg Subcutaneous Q24H Singh, Prashant K, MD   40 mg at 02/24/24 2204   EPINEPHrine  (EPI-PEN) injection 0.3 mg  0.3 mg Intramuscular PRN Singh, Prashant K, MD       fenofibrate tablet 160 mg  160 mg Oral Daily Singh, Prashant K, MD   160 mg at 02/25/24 0828   HYDROcodone -acetaminophen  (NORCO/VICODIN) 5-325 MG per tablet 1-2 tablet  1-2 tablet Oral Q6H PRN Singh, Prashant K, MD   1 tablet at 02/25/24 0827   HYDROmorphone  (DILAUDID ) injection 1 mg  1 mg Intravenous Q4H PRN Mansy, Jan A, MD   1 mg at 02/25/24  0235   lactated ringers  infusion   Intravenous Continuous Singh, Prashant K, MD 125 mL/hr at 02/25/24 0827 Rate Change at 02/25/24 0827   ondansetron  (ZOFRAN ) injection 4 mg  4 mg Intravenous Q8H PRN Singh, Prashant K, MD   4 mg at 02/25/24 0828   pantoprazole (PROTONIX) EC tablet 40 mg  40 mg Oral Daily Singh, Prashant K, MD   40 mg at 02/25/24 1308   promethazine  (PHENERGAN ) 12.5 mg in sodium chloride  0.9 % 50 mL IVPB  12.5 mg Intravenous Q6H PRN Orvilla Blander, MD 150 mL/hr at 02/24/24 1536 12.5 mg at 02/24/24 1536   rosuvastatin (CRESTOR) tablet 20 mg  20 mg Oral Daily Singh, Prashant K, MD   20 mg at 02/25/24 0834   senna-docusate (Senokot-S) tablet 1 tablet  1 tablet Oral QHS PRN Singh, Prashant K, MD   1 tablet at 02/24/24 2204    Allergies as of 02/23/2024 - Review Complete 02/23/2024  Allergen Reaction Noted   Sulfa antibiotics Rash 03/10/2011  Family History  Problem Relation Age of Onset   Other Mother        +anticardiolipin antibody   Migraines Mother    Cholelithiasis Father    Depression Sister    Depression Brother    Bipolar disorder Brother    Other Brother        chronic back pain   Diabetes Maternal Aunt    Diabetes Paternal Uncle    Heart disease Maternal Grandmother        MI, CAD, died at 48, new MI   Cancer Maternal Grandfather 64       colorectal   Diabetes Maternal Grandfather    Celiac disease Neg Hx    Stroke Neg Hx     Social History   Socioeconomic History   Marital status: Single    Spouse name: Not on file   Number of children: Not on file   Years of education: Not on file   Highest education level: Not on file  Occupational History   Not on file  Tobacco Use   Smoking status: Never    Passive exposure: Current   Smokeless tobacco: Never   Tobacco comments:    second hand smoke exposure  Vaping Use   Vaping status: Never Used  Substance and Sexual Activity   Alcohol use: No   Drug use: No   Sexual activity: Never  Other Topics  Concern   Not on file  Social History Narrative   Lives with mother.   Part time with father and 2 half brothers there.  Hobbies - dance, singing      Fall 2024- freshman at [employer]: Not on [employer] Insecurity: Not on file  Transportation Needs: Not on file  Physical Activity: Not on file  Stress: Not on file  Social Connections: Not on file  Intimate Partner Violence: Not on file    Review of Systems: Pertinent positive and negative review of systems were noted in the above HPI section.  All other review of systems was otherwise negative.   Physical Exam: Vital signs in last 24 hours: Temp:  [98.3 F (36.8 C)-99.1 F (37.3 C)] 98.3 F (36.8 C) (06/07 0800) Pulse Rate:  [101-123] 123 (06/07 0800) Resp:  [15-33] 33 (06/07 0800) BP: (92-138)/(54-86) 103/64 (06/07 0800) SpO2:  [96 %-99 %] 98 % (06/07 0400) Last BM Date : 02/22/24 General:   Alert,  Well-developed, well-nourished,obese young WF  pleasant and cooperative in NAD-family at bedside Head:  Normocephalic and atraumatic. Eyes:  Sclera clear, no icterus.   Conjunctiva pink. Ears:  Normal auditory acuity. Nose:  No deformity, discharge,  or lesions. Mouth:  No deformity or lesions.   Neck:  Supple; no masses or thyromegaly. Lungs:  Clear throughout to auscultation.  Decreased breath sounds bilateral bases  heart: tachy Regular rate and rhythm; no murmurs, clicks, rubs,  or gallops. Abdomen:  Soft, few bowel sounds present, she is tender across the upper abdomen no rebound, seems more tender in the epigastrium left upper quadrant Wall mass or hepatosplenomegaly Rectal: Not done Msk:  Symmetrical without gross deformities. . Pulses:  Normal pulses noted. Extremities:  Without clubbing or edema. Neurologic:  Alert and  oriented x4;  grossly normal neurologically. Skin:  Intact without significant lesions or rashes.. Psych:  Alert and cooperative. Normal mood and  affect.  Intake/Output from previous day: No intake/output data recorded. Intake/Output this shift: No intake/output data recorded.  Lab Results: Recent Labs    02/24/24 0040 02/24/24 0515 02/24/24 1700 02/25/24 0556  WBC 21.4*  --  18.2* 16.1*  HGB 13.1 10.5* 12.1 10.5*  HCT 38.5 31.0* 35.2* 31.6*  PLT 430*  --  379 326   BMET Recent Labs    02/24/24 0040 02/24/24 0515 02/24/24 0628 02/24/24 1700 02/25/24 0556  NA 133* 136 135  --  134*  K 4.1 4.0 3.6  --  4.1  CL 96*  --  104  --  103  CO2 <7*  --  17*  --  22  GLUCOSE 115*  --  107*  --  92  BUN 7  --  5*  --  5*  CREATININE 0.62  --  0.42* 0.48 0.61  CALCIUM 9.6  --  7.9*  --  8.6*   LFT Recent Labs    02/25/24 0556  PROT 5.9*  ALBUMIN 2.4*  AST 12*  ALT 14  ALKPHOS 49  BILITOT 0.9   PT/INR Recent Labs    02/25/24 0556  LABPROT 14.4  INR 1.1   Hepatitis Panel No results for input(s): "HEPBSAG", "HCVAB", "HEPAIGM", "HEPBIGM" in the last 72 hours.   IMPRESSION:  #56 18 year old female with history of PCOS and migraines with acute onset of severe upper abdominal pain radiating to the back with nausea and vomiting on the afternoon of 02/23/2024.  Pain constant and unrelenting and came to the emergency room  Workup consistent with acute pancreatitis with SIRS Patient has had tachycardia, leukocytosis, and low-grade fever in the 99 range  No current parameters suggestive of severe pancreatitis, leukocytosis is resolving, renal function normal  Etiology of the pancreatitis is not clear, no regular EtOH, CT negative for gallstones/sludge, Patient does have cousin with recent pancreatitis secondary to hypertriglyceridemia-patient does have an elevated triglyceride level but not high enough to cause pancreatitis.  Consider autoimmune, familial, versus biliary.  Plan; continue liberal fluids/LR at 125 an hour Patient encouraged to sit up, and move around the room, added incentive spirometry every  hour Start clear liquids, advised to just sip initially and to back off if increases pain or causes vomiting  Scheduled for upper abdominal ultrasound CRP Await IgG4 Continue to trend labs GI will follow with you    Bing Duffey EsterwoodPA-C  02/25/2024, 12:26 PM

## 2024-02-25 NOTE — Progress Notes (Signed)
 PROGRESS NOTE                                                                                                                                                                                                             Patient Demographics:    Judy Hughes, is a 18 y.o. female, DOB - 08-Dec-2005, QMV:[id number]  Outpatient Primary MD for the patient is Klett, Freya Jesus, NP    LOS - 0  Admit date - 02/24/2024    Chief Complaint  Patient presents with   Abdominal Pain   Flank Pain    L       Brief Narrative (HPI from H&P)   18 y.o. female, with history of PCOS, seasonal allergies, UTIs, migraine, lipidemia for which she has not taken a medication in few years comes to the hospital after experiencing sharp epigastric abdominal pain which started abruptly yesterday at home, pain is radiating to her back and associated with nausea and vomiting, worse with eating food better with bowel rest and pain medications, went to [facility] where workup was suggestive of acute pancreatitis and she was sent to [facility] for further admission.  Her LFTs were stable, she had reactive leukocytosis, CT scan confirmed pancreatitis with no CBD dilation or gallstones.    Subjective:    Judy Hughes today has, No headache, No chest pain, ++ abdominal pain - No Nausea, No new weakness tingling or numbness, no SOB   Assessment  & Plan :    Acute pancreatitis.  Unclear etiology, LFTs stable, CBD looks unremarkable on CT, no history of alcohol abuse, LDL is marginally high for which she has been placed on Tricor, have ordered IgG4, will continue with bowel rest, IV fluids and pain control.   She does take oral contraceptive pills and could be a potential trigger.  GI to opine as well.   2.  Leukocytosis.  Reactive. Monitor.   3.  History of allergies.  Supportive care.   4.  Dyslipidemia.  Marginally elevated so #1 LDL and triglycerides, low-dose  statin and Tricor.        Condition - Fair  Family Communication  : Mother bedside 02/24/2024, 02/25/2024  Code Status :  Full  Consults  :  GI  PUD Prophylaxis : PPI   Procedures  :     CT 02/24/24 -  1. Acute pancreatitis, as above. No walled-off fluid collection/pseudocyst.      Disposition Plan  :    Status is: Observation   DVT Prophylaxis  :    enoxaparin  (LOVENOX ) injection 40 mg Start: 02/24/24 2200    Lab Results  Component Value Date   PLT 326 02/25/2024    Diet :  Diet Order             Diet NPO time specified Except for: Sips with Meds  Diet effective now                    Inpatient Medications  Scheduled Meds:  enoxaparin  (LOVENOX ) injection  40 mg Subcutaneous Q24H   fenofibrate  160 mg Oral Daily   pantoprazole  40 mg Oral Daily   Continuous Infusions:  lactated ringers      promethazine  (PHENERGAN ) injection (IM or IVPB) 12.5 mg (02/24/24 1536)   PRN Meds:.acetaminophen  **OR** acetaminophen , albuterol , EPINEPHrine , HYDROcodone -acetaminophen , HYDROmorphone  (DILAUDID ) injection, ondansetron  (ZOFRAN ) IV, promethazine  (PHENERGAN ) injection (IM or IVPB), senna-docusate  Antibiotics  :    Anti-infectives (From admission, onward)    None         Objective:   Vitals:   02/24/24 2338 02/25/24 0000 02/25/24 0200 02/25/24 0400  BP: (!) 97/54 (!) 93/54  92/63  Pulse: (!) 101   (!) 116  Resp: 15  (!) 25 (!) 21  Temp: 99.1 F (37.3 C)   99 F (37.2 C)  TempSrc: Oral   Oral  SpO2:    98%    Wt Readings from Last 3 Encounters:  11/04/23 88.7 kg (97%, Z= 1.90)*  09/27/23 86.6 kg (97%, Z= 1.83)*  05/06/23 89.4 kg (97%, Z= 1.93)*   * Growth percentiles are based on CDC (Girls, 2-20 Years) data.    No intake or output data in the 24 hours ending 02/25/24 0811   Physical Exam  Awake Alert, No new F.N deficits, Normal affect [place].AT,PERRAL Supple Neck, No JVD,   Symmetrical Chest wall movement, Good air movement bilaterally,  CTAB RRR,No Gallops,Rubs or new Murmurs,  +ve B.Sounds, Abd Soft, +ve epigastric tenderness,   No Cyanosis, Clubbing or edema        Data Review:    Recent Labs  Lab 02/24/24 0040 02/24/24 0515 02/24/24 1700 02/25/24 0556  WBC 21.4*  --  18.2* 16.1*  HGB 13.1 10.5* 12.1 10.5*  HCT 38.5 31.0* 35.2* 31.6*  PLT 430*  --  379 326  MCV 83.5  --  83.8 85.4  MCH 28.4  --  28.8 28.4  MCHC 34.0  --  34.4 33.2  RDW 12.6  --  13.1 13.2  LYMPHSABS  --   --   --  2.1  MONOABS  --   --   --  1.0  EOSABS  --   --   --  0.1  BASOSABS  --   --   --  0.1    Recent Labs  Lab 02/24/24 0040 02/24/24 0446 02/24/24 0515 02/24/24 0628 02/24/24 1700 02/25/24 0556  NA 133*  --  136 135  --  134*  K 4.1  --  4.0 3.6  --  4.1  CL 96*  --   --  104  --  103  CO2 <7*  --   --  17*  --  22  ANIONGAP NOT CALCULATED  --   --  15  --  9  GLUCOSE 115*  --   --  107*  --  92  BUN 7  --   --  5*  --  5*  CREATININE 0.62  --   --  0.42* 0.48 0.61  AST 19  --   --   --   --  12*  ALT 19  --   --   --   --  14  ALKPHOS 90  --   --   --   --  49  BILITOT <0.2  --   --   --   --  0.9  ALBUMIN 4.1  --   --   --   --  2.4*  LATICACIDVEN  --  1.6  --  1.1  --   --   INR  --   --   --   --   --  1.1  HGBA1C  --   --   --   --  4.6*  --   MG  --   --   --   --   --  1.7  PHOS  --   --   --   --   --  3.4  CALCIUM 9.6  --   --  7.9*  --  8.6*      Recent Labs  Lab 02/24/24 0040 02/24/24 0446 02/24/24 0628 02/24/24 1700 02/25/24 0556  LATICACIDVEN  --  1.6 1.1  --   --   INR  --   --   --   --  1.1  HGBA1C  --   --   --  4.6*  --   MG  --   --   --   --  1.7  CALCIUM 9.6  --  7.9*  --  8.6*    --------------------------------------------------------------------------------------------------------------- Lab Results  Component Value Date   CHOL 243 (H) 02/24/2024   HDL 48 02/24/2024   LDLCALC 117 (H) 02/24/2024   TRIG 391 (H) 02/24/2024   CHOLHDL 5.1 02/24/2024    Lab Results   Component Value Date   HGBA1C 4.6 (L) 02/24/2024   No results for input(s): "TSH", "T4TOTAL", "FREET4", "T3FREE", "THYROIDAB" in the last 72 hours. No results for input(s): "VITAMINB12", "FOLATE", "FERRITIN", "TIBC", "IRON", "RETICCTPCT" in the last 72 hours. ------------------------------------------------------------------------------------------------------------------ Cardiac Enzymes No results for input(s): "CKMB", "TROPONINI", "MYOGLOBIN" in the last 168 hours.  Invalid input(s): "CK"  Micro Results No results found for this or any previous visit (from the past 240 hours).  Radiology Report CT ABDOMEN PELVIS W CONTRAST Result Date: 02/24/2024 EXAM: CT ABDOMEN AND PELVIS WITH CONTRAST 02/24/2024 01:41:04 AM TECHNIQUE: CT of the abdomen and pelvis was performed with the administration of intravenous contrast. Multiplanar reformatted images are provided for review. Automated exposure control, iterative reconstruction, and/or weight based adjustment of the mA/kV was utilized to reduce the radiation dose to as low as reasonably achievable. COMPARISON: None available. CLINICAL HISTORY: Abdominal pain, acute, nonlocalized. Pt c/o abd pain, NV onset "earlier today, kept getting worse. Thought it was trapped gas or something- took gas pills/ pepto pills." L side/ flank pain. Pt w familial hx of kidney stones. FINDINGS: LOWER CHEST: No acute abnormality. LIVER: The liver is unremarkable. GALLBLADDER AND BILE DUCTS: Gallbladder is unremarkable. No biliary ductal dilatation. SPLEEN: No acute abnormality. PANCREAS: Peripancreatic fluid/inflammatory changes along the pancreatic tail image 27, suggesting acute pancreatitis. No walled-off fluid collection/pseudocyst. ADRENAL GLANDS: No acute abnormality. KIDNEYS, URETERS AND BLADDER: No stones in the kidneys or ureters. No hydronephrosis. No perinephric or periureteral stranding. Urinary  bladder is unremarkable. GI AND BOWEL: Stomach demonstrates no acute  abnormality. There is no bowel obstruction. No appendicitis. Normal appendix (image 70). PERITONEUM AND RETROPERITONEUM: No ascites. No free air. VASCULATURE: Aorta is normal in caliber. LYMPH NODES: No lymphadenopathy. REPRODUCTIVE ORGANS: No acute abnormality. BONES AND SOFT TISSUES: No acute osseous abnormality. No focal soft tissue abnormality. IMPRESSION: 1. Acute pancreatitis, as above. No walled-off fluid collection/pseudocyst. Electronically signed by: Zadie Herter MD 02/24/2024 01:44 AM EDT RP Workstation: [id number]     Signature  -   Lynnwood Sauer M.D on 02/25/2024 at 8:11 AM   -  To page go to [email or web address]

## 2024-02-26 DIAGNOSIS — K859 Acute pancreatitis without necrosis or infection, unspecified: Secondary | ICD-10-CM | POA: Diagnosis not present

## 2024-02-26 DIAGNOSIS — Z7722 Contact with and (suspected) exposure to environmental tobacco smoke (acute) (chronic): Secondary | ICD-10-CM

## 2024-02-26 DIAGNOSIS — J90 Pleural effusion, not elsewhere classified: Secondary | ICD-10-CM

## 2024-02-26 LAB — CBC WITH DIFFERENTIAL/PLATELET
Abs Immature Granulocytes: 0.11 10*3/uL — ABNORMAL HIGH (ref 0.00–0.07)
Basophils Absolute: 0 10*3/uL (ref 0.0–0.1)
Basophils Relative: 0 %
Eosinophils Absolute: 0.2 10*3/uL (ref 0.0–0.5)
Eosinophils Relative: 1 %
HCT: 29.4 % — ABNORMAL LOW (ref 36.0–46.0)
Hemoglobin: 9.5 g/dL — ABNORMAL LOW (ref 12.0–15.0)
Immature Granulocytes: 1 %
Lymphocytes Relative: 14 %
Lymphs Abs: 2.2 10*3/uL (ref 0.7–4.0)
MCH: 28.1 pg (ref 26.0–34.0)
MCHC: 32.3 g/dL (ref 30.0–36.0)
MCV: 87 fL (ref 80.0–100.0)
Monocytes Absolute: 1.3 10*3/uL — ABNORMAL HIGH (ref 0.1–1.0)
Monocytes Relative: 8 %
Neutro Abs: 12.3 10*3/uL — ABNORMAL HIGH (ref 1.7–7.7)
Neutrophils Relative %: 76 %
Platelets: 329 10*3/uL (ref 150–400)
RBC: 3.38 MIL/uL — ABNORMAL LOW (ref 3.87–5.11)
RDW: 13.1 % (ref 11.5–15.5)
WBC: 16 10*3/uL — ABNORMAL HIGH (ref 4.0–10.5)
nRBC: 0 % (ref 0.0–0.2)

## 2024-02-26 LAB — COMPREHENSIVE METABOLIC PANEL WITH GFR
ALT: 10 U/L (ref 0–44)
AST: 11 U/L — ABNORMAL LOW (ref 15–41)
Albumin: 2.3 g/dL — ABNORMAL LOW (ref 3.5–5.0)
Alkaline Phosphatase: 50 U/L (ref 38–126)
Anion gap: 11 (ref 5–15)
BUN: <5 mg/dL — ABNORMAL LOW (ref 6–20)
CO2: 21 mmol/L — ABNORMAL LOW (ref 22–32)
Calcium: 8.3 mg/dL — ABNORMAL LOW (ref 8.9–10.3)
Chloride: 101 mmol/L (ref 98–111)
Creatinine, Ser: 0.53 mg/dL (ref 0.44–1.00)
GFR, Estimated: >60 mL/min (ref >60)
Glucose, Bld: 82 mg/dL (ref 70–99)
Potassium: 3.7 mmol/L (ref 3.5–5.1)
Sodium: 133 mmol/L — ABNORMAL LOW (ref 135–145)
Total Bilirubin: 1 mg/dL (ref 0.0–1.2)
Total Protein: 5.9 g/dL — ABNORMAL LOW (ref 6.5–8.1)

## 2024-02-26 LAB — PHOSPHORUS: Phosphorus: 2.5 mg/dL (ref 2.5–4.6)

## 2024-02-26 LAB — MAGNESIUM: Magnesium: 1.8 mg/dL (ref 1.7–2.4)

## 2024-02-26 MED ORDER — NON FORMULARY: Freq: Every day | Status: DC

## 2024-02-26 MED ORDER — BOOST / RESOURCE BREEZE PO LIQD CUSTOM
1.0000 | Freq: Three times a day (TID) | ORAL | Status: DC
  Administered 2024-02-26 – 2024-02-28 (×4): 1 via ORAL

## 2024-02-26 MED ORDER — DESOGESTREL-ETHINYL ESTRADIOL 0.15-30 MG-MCG PO TABS
1.0000 | ORAL_TABLET | Freq: Every evening | ORAL | Status: DC
  Administered 2024-02-26 – 2024-02-27 (×2): 1 via ORAL

## 2024-02-26 MED ORDER — LACTATED RINGERS IV SOLN: INTRAVENOUS | Status: DC

## 2024-02-26 MED ORDER — SIMETHICONE 80 MG PO CHEW
80.0000 mg | CHEWABLE_TABLET | Freq: Four times a day (QID) | ORAL | Status: DC | PRN
  Administered 2024-02-26 – 2024-02-27 (×3): 80 mg via ORAL
  Filled 2024-02-26 (×4): qty 1

## 2024-02-26 NOTE — Progress Notes (Signed)
 Patient ID: Judy Hughes, female   DOB: 2005-12-27, 18 y.o.   MRN: [id number]    Progress Note   Subjective   Day # 3 CC; acute pancreatitis  Labs today-WBC 16.0 stable/hemoglobin 9.5/hematocrit 29.4 Sodium 133/potassium 3.7/BUN less than 5/creatinine 0.53 LFTs normal CRP 13.9 -6/6 IgG4 pending  Chest x-ray last p.m. small left pleural effusion associated atelectasis or pneumonia Abdominal ultrasound-no evidence of gallstones or sludge, no gallbladder wall thickening, CBD 3 mm  Patient notes mild pain in her left shoulder last night, it was transient and has not recurred, no nausea or vomiting but very hesitant to try any p.o.'s, afraid it will worsen her pain.  Mom and patient asking about her birth control pills which she has been on long-term for PCOS.  They would like to resume if okay with us    Objective   Vital signs in last 24 hours: Temp:  [97.8 F (36.6 C)-98.5 F (36.9 C)] 98.2 F (36.8 C) (06/08 0806) Pulse Rate:  [106-120] 106 (06/08 0350) Resp:  [18-29] 21 (06/08 0806) BP: (111-118)/(62-80) 117/80 (06/08 0806) SpO2:  [94 %-99 %] 96 % (06/08 0806) Last BM Date : 02/22/24 General: Young   white female in NAD Heart:  Regular rate and rhythm; no murmurs Lungs: Respirations even and unlabored, somewhat decreased breath sounds bases Abdomen:  Soft, remains tender across the upper abdomen, no rebound , sounds are present Extremities:  Without edema. Neurologic:  Alert and oriented,  grossly normal neurologically. Psych:  Cooperative. Normal mood and affect.  Intake/Output from previous day: 06/07 0701 - 06/08 0700 In: 2524 [I.V.:2426.5; IV Piggyback:97.5] Out: -  Intake/Output this shift: Total I/O In: 185 [P.O.:60; I.V.:125] Out: -   Lab Results: Recent Labs    02/24/24 1700 02/25/24 0556 02/26/24 0601  WBC 18.2* 16.1* 16.0*  HGB 12.1 10.5* 9.5*  HCT 35.2* 31.6* 29.4*  PLT 379 326 329   BMET Recent Labs    02/24/24 0628 02/24/24 1700  02/25/24 0556 02/26/24 0601  NA 135  --  134* 133*  K 3.6  --  4.1 3.7  CL 104  --  103 101  CO2 17*  --  22 21*  GLUCOSE 107*  --  92 82  BUN 5*  --  5* <5*  CREATININE 0.42* 0.48 0.61 0.53  CALCIUM 7.9*  --  8.6* 8.3*   LFT Recent Labs    02/26/24 0601  PROT 5.9*  ALBUMIN 2.3*  AST 11*  ALT 10  ALKPHOS 50  BILITOT 1.0   PT/INR Recent Labs    02/25/24 0556  LABPROT 14.4  INR 1.1    Studies/Results: DG Chest Port 1 View Result Date: 02/25/2024 CLINICAL DATA:  Chest pain EXAM: PORTABLE CHEST 1 VIEW COMPARISON:  06/06/2015 FINDINGS: Low lung volumes accentuate pulmonary vascularity and cardiomediastinal silhouette. Left basilar airspace opacities and small left pleural effusion. No pneumothorax. No displaced rib fractures. IMPRESSION: Small left pleural effusion with associated atelectasis or pneumonia. Electronically Signed   By: Rozell Cornet M.D.   On: 02/25/2024 21:45   US  Abdomen Complete Result Date: 02/25/2024 CLINICAL DATA:  Pancreatitis. EXAM: ABDOMEN ULTRASOUND COMPLETE COMPARISON:  Abdominal ultrasound dated 08/02/2012 and CT abdomen pelvis dated 02/24/2024. FINDINGS: Gallbladder: No gallstones or wall thickening visualized. No sonographic Murphy sign noted by sonographer. Common bile duct: Diameter: 3 mm. Liver: No focal lesion identified. Within normal limits in parenchymal echogenicity. Portal vein is patent on color Doppler imaging with normal direction of blood flow towards the liver. IVC:  No abnormality visualized. Pancreas: The pancreas is poorly visualized and obscured by bowel gas. Spleen: Size and appearance within normal limits. Right Kidney: Length: 12 cm. Echogenicity within normal limits. No mass or hydronephrosis visualized. Left Kidney: Length: 14 cm. Echogenicity within normal limits. No mass or hydronephrosis visualized. Abdominal aorta: No aneurysm visualized. Other findings: None. IMPRESSION: Unremarkable abdominal ultrasound.  No gallstone.  Electronically Signed   By: Angus Bark M.D.   On: 02/25/2024 16:07       Assessment / Plan:    #23 18 year old white female with acute pancreatitis of unclear etiology, with SIRS. Parameters are all stable IV fluids were stopped today--patient is not really taking any p.o.'s, encouraged her to try to push clear liquids have added resource Berry breeze Will need low rate IV fluids continued until she is taking some p.o.'s  Autoimmune pancreatitis possible, IgG4 still pending\ Oral contraceptives were stopped on admission-though not impossible unlikely culprit for her acute pancreatitis, and in setting of PCOS and need for BCP would favor letting her start her birth control pill.  #2 small left effusion-reactive to acute pancreatitis  Plan; clear liquid diet, add resource berry breeze Restart lower rate IV fluids/LR Encourage patient to be up to the chair, and trying to ambulate Follow-up labs in a.m. Will restart oral contraceptive  GI will continue to follow    Principal Problem:   Acute pancreatitis     LOS: 1 day   Kealie Barrie PA-C6/04/2024, 10:52 AM

## 2024-02-26 NOTE — Plan of Care (Signed)
  Problem: Clinical Measurements: Goal: Ability to maintain clinical measurements within normal limits will improve Outcome: Progressing Goal: Will remain free from infection Outcome: Progressing   Problem: Coping: Goal: Level of anxiety will decrease Outcome: Progressing   Problem: Pain Managment: Goal: General experience of comfort will improve and/or be controlled Outcome: Progressing

## 2024-02-26 NOTE — Progress Notes (Signed)
 PROGRESS NOTE                                                                                                                                                                                                             Patient Demographics:    Judy Hughes, is a 18 y.o. female, DOB - 31-Jan-2006, ZOX:[id number]  Outpatient Primary MD for the patient is Klett, Freya Jesus, NP    LOS - 1  Admit date - 02/24/2024    Chief Complaint  Patient presents with   Abdominal Pain   Flank Pain    L       Brief Narrative (HPI from H&P)   18 y.o. female, with history of PCOS, seasonal allergies, UTIs, migraine, lipidemia for which she has not taken a medication in few years comes to the hospital after experiencing sharp epigastric abdominal pain which started abruptly yesterday at home, pain is radiating to her back and associated with nausea and vomiting, worse with eating food better with bowel rest and pain medications, went to [facility] where workup was suggestive of acute pancreatitis and she was sent to [facility] for further admission.  Her LFTs were stable, she had reactive leukocytosis, CT scan confirmed pancreatitis with no CBD dilation or gallstones.    Subjective:   Patient in bed, appears to be in no distress, overall feels better today, currently no chest pain or shortness of breath, epigastric abdominal pain has improved.  No focal weakness.   Assessment  & Plan :    Acute pancreatitis.  Unclear etiology, LFTs stable, CBD looks unremarkable on CT, no history of alcohol abuse, LDL is marginally high for which she has been placed on Tricor, have ordered IgG4, will continue with bowel rest, IV fluids and pain control.  Only potential culprit appears to be oral contraceptive pill which has been held, GI on board, clinically improved on 02/26/2023.  2.  Leukocytosis.  Reactive. Monitor.   3.  History of allergies.  Supportive  care.   4.  Dyslipidemia.  Marginally elevated so #1 LDL and triglycerides, low-dose statin and Tricor.        Condition - Fair  Family Communication  : Mother bedside 02/24/2024, 02/25/2024, 02/26/2024  Code Status :  Full  Consults  :  GI  PUD Prophylaxis : PPI   Procedures  :  CT 02/24/24 -  1. Acute pancreatitis, as above. No walled-off fluid collection/pseudocyst.      Disposition Plan  :    Status is: Observation   DVT Prophylaxis  :    enoxaparin  (LOVENOX ) injection 40 mg Start: 02/24/24 2200    Lab Results  Component Value Date   PLT 329 02/26/2024    Diet :  Diet Order             Diet clear liquid Room service appropriate? Yes; Fluid consistency: Thin  Diet effective now                    Inpatient Medications  Scheduled Meds:  enoxaparin  (LOVENOX ) injection  40 mg Subcutaneous Q24H   fenofibrate  160 mg Oral Daily   pantoprazole  40 mg Oral Daily   rosuvastatin  20 mg Oral Daily   Continuous Infusions:  promethazine  (PHENERGAN ) injection (IM or IVPB) Stopped (02/25/24 2204)   PRN Meds:.acetaminophen  **OR** acetaminophen , albuterol , EPINEPHrine , HYDROcodone -acetaminophen , HYDROmorphone  (DILAUDID ) injection, ondansetron  (ZOFRAN ) IV, promethazine  (PHENERGAN ) injection (IM or IVPB), senna-docusate, simethicone  Antibiotics  :    Anti-infectives (From admission, onward)    None         Objective:   Vitals:   02/25/24 2337 02/25/24 2354 02/26/24 0350 02/26/24 0806  BP: 118/80  111/62 117/80  Pulse: (!) 118  (!) 106   Resp: 18  (!) 22   Temp: 98.3 F (36.8 C)  98.4 F (36.9 C) 98.2 F (36.8 C)  TempSrc: Oral  Oral Oral  SpO2:  98% 94% 96%    Wt Readings from Last 3 Encounters:  11/04/23 88.7 kg (97%, Z= 1.90)*  09/27/23 86.6 kg (97%, Z= 1.83)*  05/06/23 89.4 kg (97%, Z= 1.93)*   * Growth percentiles are based on CDC (Girls, 2-20 Years) data.    No intake or output data in the 24 hours ending 02/26/24 0845   Physical  Exam  Awake Alert, No new F.N deficits, Normal affect [place].AT,PERRAL Supple Neck, No JVD,   Symmetrical Chest wall movement, Good air movement bilaterally, CTAB RRR,No Gallops,Rubs or new Murmurs,  +ve B.Sounds, Abd Soft, +ve epigastric tenderness,   No Cyanosis, Clubbing or edema        Data Review:    Recent Labs  Lab 02/24/24 0040 02/24/24 0515 02/24/24 1700 02/25/24 0556 02/26/24 0601  WBC 21.4*  --  18.2* 16.1* 16.0*  HGB 13.1 10.5* 12.1 10.5* 9.5*  HCT 38.5 31.0* 35.2* 31.6* 29.4*  PLT 430*  --  379 326 329  MCV 83.5  --  83.8 85.4 87.0  MCH 28.4  --  28.8 28.4 28.1  MCHC 34.0  --  34.4 33.2 32.3  RDW 12.6  --  13.1 13.2 13.1  LYMPHSABS  --   --   --  2.1 2.2  MONOABS  --   --   --  1.0 1.3*  EOSABS  --   --   --  0.1 0.2  BASOSABS  --   --   --  0.1 0.0    Recent Labs  Lab 02/24/24 0040 02/24/24 0446 02/24/24 0515 02/24/24 0628 02/24/24 1700 02/25/24 0556 02/25/24 2206 02/26/24 0601  NA 133*  --  136 135  --  134*  --  133*  K 4.1  --  4.0 3.6  --  4.1  --  3.7  CL 96*  --   --  104  --  103  --  101  CO2 <7*  --   --  17*  --  22  --  21*  ANIONGAP NOT CALCULATED  --   --  15  --  9  --  11  GLUCOSE 115*  --   --  107*  --  92  --  82  BUN 7  --   --  5*  --  5*  --  <5*  CREATININE 0.62  --   --  0.42* 0.48 0.61  --  0.53  AST 19  --   --   --   --  12*  --  11*  ALT 19  --   --   --   --  14  --  10  ALKPHOS 90  --   --   --   --  49  --  50  BILITOT <0.2  --   --   --   --  0.9  --  1.0  ALBUMIN 4.1  --   --   --   --  2.4*  --  2.3*  CRP  --   --   --   --  13.9*  --   --   --   LATICACIDVEN  --  1.6  --  1.1  --   --  1.4  --   INR  --   --   --   --   --  1.1  --   --   HGBA1C  --   --   --   --  4.6*  --   --   --   MG  --   --   --   --   --  1.7  --  1.8  PHOS  --   --   --   --   --  3.4  --  2.5  CALCIUM 9.6  --   --  7.9*  --  8.6*  --  8.3*      Recent Labs  Lab 02/24/24 0040 02/24/24 0446 02/24/24 0628 02/24/24 1700  02/25/24 0556 02/25/24 2206 02/26/24 0601  CRP  --   --   --  13.9*  --   --   --   LATICACIDVEN  --  1.6 1.1  --   --  1.4  --   INR  --   --   --   --  1.1  --   --   HGBA1C  --   --   --  4.6*  --   --   --   MG  --   --   --   --  1.7  --  1.8  CALCIUM 9.6  --  7.9*  --  8.6*  --  8.3*    --------------------------------------------------------------------------------------------------------------- Lab Results  Component Value Date   CHOL 243 (H) 02/24/2024   HDL 48 02/24/2024   LDLCALC 117 (H) 02/24/2024   TRIG 391 (H) 02/24/2024   CHOLHDL 5.1 02/24/2024    Lab Results  Component Value Date   HGBA1C 4.6 (L) 02/24/2024   No results for input(s): "TSH", "T4TOTAL", "FREET4", "T3FREE", "THYROIDAB" in the last 72 hours. No results for input(s): "VITAMINB12", "FOLATE", "FERRITIN", "TIBC", "IRON", "RETICCTPCT" in the last 72 hours. ------------------------------------------------------------------------------------------------------------------ Cardiac Enzymes No results for input(s): "CKMB", "TROPONINI", "MYOGLOBIN" in the last 168 hours.  Invalid input(s): "CK"  Micro Results No results found for this or any previous visit (from the past 240 hours).  Radiology Report DG Chest Port 1 View Result Date: 02/25/2024 CLINICAL DATA:  Chest pain EXAM: PORTABLE CHEST 1 VIEW COMPARISON:  06/06/2015 FINDINGS: Low lung volumes accentuate pulmonary vascularity and cardiomediastinal silhouette. Left basilar airspace opacities and small left pleural effusion. No pneumothorax. No displaced rib fractures. IMPRESSION: Small left pleural effusion with associated atelectasis or pneumonia. Electronically Signed   By: Rozell Cornet M.D.   On: 02/25/2024 21:45   US  Abdomen Complete Result Date: 02/25/2024 CLINICAL DATA:  Pancreatitis. EXAM: ABDOMEN ULTRASOUND COMPLETE COMPARISON:  Abdominal ultrasound dated 08/02/2012 and CT abdomen pelvis dated 02/24/2024. FINDINGS: Gallbladder: No gallstones or  wall thickening visualized. No sonographic Murphy sign noted by sonographer. Common bile duct: Diameter: 3 mm. Liver: No focal lesion identified. Within normal limits in parenchymal echogenicity. Portal vein is patent on color Doppler imaging with normal direction of blood flow towards the liver. IVC: No abnormality visualized. Pancreas: The pancreas is poorly visualized and obscured by bowel gas. Spleen: Size and appearance within normal limits. Right Kidney: Length: 12 cm. Echogenicity within normal limits. No mass or hydronephrosis visualized. Left Kidney: Length: 14 cm. Echogenicity within normal limits. No mass or hydronephrosis visualized. Abdominal aorta: No aneurysm visualized. Other findings: None. IMPRESSION: Unremarkable abdominal ultrasound.  No gallstone. Electronically Signed   By: Angus Bark M.D.   On: 02/25/2024 16:07     Signature  -   Lynnwood Sauer M.D on 02/26/2024 at 8:45 AM   -  To page go to [email or web address]

## 2024-02-26 NOTE — Plan of Care (Signed)

## 2024-02-27 DIAGNOSIS — K859 Acute pancreatitis without necrosis or infection, unspecified: Secondary | ICD-10-CM | POA: Diagnosis not present

## 2024-02-27 LAB — COMPREHENSIVE METABOLIC PANEL WITH GFR
ALT: 13 U/L (ref 0–44)
AST: 17 U/L (ref 15–41)
Albumin: 2.5 g/dL — ABNORMAL LOW (ref 3.5–5.0)
Alkaline Phosphatase: 53 U/L (ref 38–126)
Anion gap: 10 (ref 5–15)
BUN: <5 mg/dL — ABNORMAL LOW (ref 6–20)
CO2: 23 mmol/L (ref 22–32)
Calcium: 8.8 mg/dL — ABNORMAL LOW (ref 8.9–10.3)
Chloride: 103 mmol/L (ref 98–111)
Creatinine, Ser: 0.67 mg/dL (ref 0.44–1.00)
GFR, Estimated: >60 mL/min (ref >60)
Glucose, Bld: 96 mg/dL (ref 70–99)
Potassium: 3.7 mmol/L (ref 3.5–5.1)
Sodium: 136 mmol/L (ref 135–145)
Total Bilirubin: 0.8 mg/dL (ref 0.0–1.2)
Total Protein: 6.4 g/dL — ABNORMAL LOW (ref 6.5–8.1)

## 2024-02-27 LAB — CBC WITH DIFFERENTIAL/PLATELET
Abs Immature Granulocytes: 0.1 10*3/uL — ABNORMAL HIGH (ref 0.00–0.07)
Basophils Absolute: 0.1 10*3/uL (ref 0.0–0.1)
Basophils Relative: 1 %
Eosinophils Absolute: 0.3 10*3/uL (ref 0.0–0.5)
Eosinophils Relative: 2 %
HCT: 30.8 % — ABNORMAL LOW (ref 36.0–46.0)
Hemoglobin: 9.9 g/dL — ABNORMAL LOW (ref 12.0–15.0)
Immature Granulocytes: 1 %
Lymphocytes Relative: 18 %
Lymphs Abs: 2.7 10*3/uL (ref 0.7–4.0)
MCH: 27.8 pg (ref 26.0–34.0)
MCHC: 32.1 g/dL (ref 30.0–36.0)
MCV: 86.5 fL (ref 80.0–100.0)
Monocytes Absolute: 1 10*3/uL (ref 0.1–1.0)
Monocytes Relative: 7 %
Neutro Abs: 10.8 10*3/uL — ABNORMAL HIGH (ref 1.7–7.7)
Neutrophils Relative %: 71 %
Platelets: 420 10*3/uL — ABNORMAL HIGH (ref 150–400)
RBC: 3.56 MIL/uL — ABNORMAL LOW (ref 3.87–5.11)
RDW: 12.8 % (ref 11.5–15.5)
WBC: 15 10*3/uL — ABNORMAL HIGH (ref 4.0–10.5)
nRBC: 0 % (ref 0.0–0.2)

## 2024-02-27 LAB — C-REACTIVE PROTEIN: CRP: 23.8 mg/dL — ABNORMAL HIGH (ref <1.0)

## 2024-02-27 LAB — IGG 4: IgG, Subclass 4: 13 mg/dL (ref 3–104)

## 2024-02-27 LAB — PHOSPHORUS: Phosphorus: 3.3 mg/dL (ref 2.5–4.6)

## 2024-02-27 LAB — PROCALCITONIN: Procalcitonin: <0.1 ng/mL

## 2024-02-27 LAB — MAGNESIUM: Magnesium: 1.8 mg/dL (ref 1.7–2.4)

## 2024-02-27 MED ORDER — LACTATED RINGERS IV SOLN: INTRAVENOUS | Status: DC

## 2024-02-27 NOTE — Progress Notes (Signed)
 PROGRESS NOTE                                                                                                                                                                                                             Patient Demographics:    Judy Hughes, is a 18 y.o. female, DOB - 10-01-2005, ONG:[id number]  Outpatient Primary MD for the patient is Klett, Freya Jesus, NP    LOS - 2  Admit date - 02/24/2024    Chief Complaint  Patient presents with   Abdominal Pain   Flank Pain    L       Brief Narrative (HPI from H&P)   18 y.o. female, with history of PCOS, seasonal allergies, UTIs, migraine, lipidemia for which she has not taken a medication in few years comes to the hospital after experiencing sharp epigastric abdominal pain which started abruptly yesterday at home, pain is radiating to her back and associated with nausea and vomiting, worse with eating food better with bowel rest and pain medications, went to [facility] where workup was suggestive of acute pancreatitis and she was sent to [facility] for further admission.  Her LFTs were stable, she had reactive leukocytosis, CT scan confirmed pancreatitis with no CBD dilation or gallstones.    Subjective:   Patient in bed, appears comfortable, denies any headache, no fever, no chest pain or pressure, no shortness of breath , claims that her epigastric abdominal pain is improving. No focal weakness.   Assessment  & Plan :    Acute pancreatitis.  Unclear etiology, LFTs stable, CBD looks unremarkable on CT, no history of alcohol abuse, LDL is marginally high for which she has been placed on Tricor, have ordered IgG4, Antley placed on clear liquids per GI since 02/26/2024, continue IV fluids and pain control.  Only potential culprit appears to be oral contraceptive pill, per GI continue with caution and monitor.  2.  Leukocytosis.  Reactive. Monitor.   3.  History of  allergies.  Supportive care.   4.  Dyslipidemia.  Marginally elevated so #1 LDL and triglycerides, low-dose statin and Tricor.        Condition - Fair  Family Communication  : Mother bedside 02/24/2024, 02/25/2024, 02/26/2024, friend at bedside 02/27/2024  Code Status :  Full  Consults  :  GI  PUD Prophylaxis :  PPI   Procedures  :     CT 02/24/24 -  1. Acute pancreatitis, as above. No walled-off fluid collection/pseudocyst.      Disposition Plan  :    Status is: Observation   DVT Prophylaxis  :        Lab Results  Component Value Date   PLT 420 (H) 02/27/2024    Diet :  Diet Order             Diet clear liquid Room service appropriate? Yes; Fluid consistency: Thin  Diet effective now                    Inpatient Medications  Scheduled Meds:  desogestrel -ethinyl estradiol   1 tablet Oral QPM   feeding supplement  1 Container Oral TID BM   fenofibrate  160 mg Oral Daily   pantoprazole  40 mg Oral Daily   rosuvastatin  20 mg Oral Daily   Continuous Infusions:  lactated ringers  100 mL/hr at 02/27/24 0609   promethazine  (PHENERGAN ) injection (IM or IVPB) 12.5 mg (02/27/24 0806)   PRN Meds:.acetaminophen  **OR** acetaminophen , albuterol , EPINEPHrine , HYDROcodone -acetaminophen , HYDROmorphone  (DILAUDID ) injection, ondansetron  (ZOFRAN ) IV, promethazine  (PHENERGAN ) injection (IM or IVPB), senna-docusate, simethicone  Antibiotics  :    Anti-infectives (From admission, onward)    None         Objective:   Vitals:   02/26/24 2343 02/27/24 0055 02/27/24 0408 02/27/24 0800  BP: 125/74  127/74 116/76  Pulse: (!) 106  (!) 114   Resp: 20  20 18   Temp: 98.8 F (37.1 C) 98.5 F (36.9 C) 98.7 F (37.1 C) 99 F (37.2 C)  TempSrc: Oral Oral Oral Oral  SpO2: 96%  95% 95%    Wt Readings from Last 3 Encounters:  11/04/23 88.7 kg (97%, Z= 1.90)*  09/27/23 86.6 kg (97%, Z= 1.83)*  05/06/23 89.4 kg (97%, Z= 1.93)*   * Growth percentiles are based on CDC (Girls,  2-20 Years) data.    No intake or output data in the 24 hours ending 02/27/24 0981   Physical Exam  Awake Alert, No new F.N deficits, Normal affect [place].AT,PERRAL Supple Neck, No JVD,   Symmetrical Chest wall movement, Good air movement bilaterally, CTAB RRR,No Gallops,Rubs or new Murmurs,  +ve B.Sounds, Abd Soft, +ve epigastric tenderness,   No Cyanosis, Clubbing or edema        Data Review:    Recent Labs  Lab 02/24/24 0040 02/24/24 0515 02/24/24 1700 02/25/24 0556 02/26/24 0601 02/27/24 0400  WBC 21.4*  --  18.2* 16.1* 16.0* 15.0*  HGB 13.1 10.5* 12.1 10.5* 9.5* 9.9*  HCT 38.5 31.0* 35.2* 31.6* 29.4* 30.8*  PLT 430*  --  379 326 329 420*  MCV 83.5  --  83.8 85.4 87.0 86.5  MCH 28.4  --  28.8 28.4 28.1 27.8  MCHC 34.0  --  34.4 33.2 32.3 32.1  RDW 12.6  --  13.1 13.2 13.1 12.8  LYMPHSABS  --   --   --  2.1 2.2 2.7  MONOABS  --   --   --  1.0 1.3* 1.0  EOSABS  --   --   --  0.1 0.2 0.3  BASOSABS  --   --   --  0.1 0.0 0.1    Recent Labs  Lab 02/24/24 0040 02/24/24 0446 02/24/24 0515 02/24/24 0628 02/24/24 1700 02/25/24 0556 02/25/24 2206 02/26/24 0601 02/27/24 0400  NA 133*  --  136 135  --  134*  --  133* 136  K 4.1  --  4.0 3.6  --  4.1  --  3.7 3.7  CL 96*  --   --  104  --  103  --  101 103  CO2 <7*  --   --  17*  --  22  --  21* 23  ANIONGAP NOT CALCULATED  --   --  15  --  9  --  11 10  GLUCOSE 115*  --   --  107*  --  92  --  82 96  BUN 7  --   --  5*  --  5*  --  <5* <5*  CREATININE 0.62  --   --  0.42* 0.48 0.61  --  0.53 0.67  AST 19  --   --   --   --  12*  --  11* 17  ALT 19  --   --   --   --  14  --  10 13  ALKPHOS 90  --   --   --   --  49  --  50 53  BILITOT <0.2  --   --   --   --  0.9  --  1.0 0.8  ALBUMIN 4.1  --   --   --   --  2.4*  --  2.3* 2.5*  CRP  --   --   --   --  13.9*  --   --   --   --   LATICACIDVEN  --  1.6  --  1.1  --   --  1.4  --   --   INR  --   --   --   --   --  1.1  --   --   --   HGBA1C  --   --   --   --   4.6*  --   --   --   --   MG  --   --   --   --   --  1.7  --  1.8 1.8  PHOS  --   --   --   --   --  3.4  --  2.5 3.3  CALCIUM 9.6  --   --  7.9*  --  8.6*  --  8.3* 8.8*      Recent Labs  Lab 02/24/24 0040 02/24/24 0446 02/24/24 0628 02/24/24 1700 02/25/24 0556 02/25/24 2206 02/26/24 0601 02/27/24 0400  CRP  --   --   --  13.9*  --   --   --   --   LATICACIDVEN  --  1.6 1.1  --   --  1.4  --   --   INR  --   --   --   --  1.1  --   --   --   HGBA1C  --   --   --  4.6*  --   --   --   --   MG  --   --   --   --  1.7  --  1.8 1.8  CALCIUM 9.6  --  7.9*  --  8.6*  --  8.3* 8.8*    --------------------------------------------------------------------------------------------------------------- Lab Results  Component Value Date   CHOL 243 (H) 02/24/2024   HDL 48 02/24/2024   LDLCALC 117 (H) 02/24/2024   TRIG 391 (H) 02/24/2024   CHOLHDL  5.1 02/24/2024    Lab Results  Component Value Date   HGBA1C 4.6 (L) 02/24/2024   No results for input(s): "TSH", "T4TOTAL", "FREET4", "T3FREE", "THYROIDAB" in the last 72 hours. No results for input(s): "VITAMINB12", "FOLATE", "FERRITIN", "TIBC", "IRON", "RETICCTPCT" in the last 72 hours. ------------------------------------------------------------------------------------------------------------------ Cardiac Enzymes No results for input(s): "CKMB", "TROPONINI", "MYOGLOBIN" in the last 168 hours.  Invalid input(s): "CK"  Micro Results No results found for this or any previous visit (from the past 240 hours).  Radiology Report DG Chest Port 1 View Result Date: 02/25/2024 CLINICAL DATA:  Chest pain EXAM: PORTABLE CHEST 1 VIEW COMPARISON:  06/06/2015 FINDINGS: Low lung volumes accentuate pulmonary vascularity and cardiomediastinal silhouette. Left basilar airspace opacities and small left pleural effusion. No pneumothorax. No displaced rib fractures. IMPRESSION: Small left pleural effusion with associated atelectasis or pneumonia.  Electronically Signed   By: Rozell Cornet M.D.   On: 02/25/2024 21:45   US  Abdomen Complete Result Date: 02/25/2024 CLINICAL DATA:  Pancreatitis. EXAM: ABDOMEN ULTRASOUND COMPLETE COMPARISON:  Abdominal ultrasound dated 08/02/2012 and CT abdomen pelvis dated 02/24/2024. FINDINGS: Gallbladder: No gallstones or wall thickening visualized. No sonographic Murphy sign noted by sonographer. Common bile duct: Diameter: 3 mm. Liver: No focal lesion identified. Within normal limits in parenchymal echogenicity. Portal vein is patent on color Doppler imaging with normal direction of blood flow towards the liver. IVC: No abnormality visualized. Pancreas: The pancreas is poorly visualized and obscured by bowel gas. Spleen: Size and appearance within normal limits. Right Kidney: Length: 12 cm. Echogenicity within normal limits. No mass or hydronephrosis visualized. Left Kidney: Length: 14 cm. Echogenicity within normal limits. No mass or hydronephrosis visualized. Abdominal aorta: No aneurysm visualized. Other findings: None. IMPRESSION: Unremarkable abdominal ultrasound.  No gallstone. Electronically Signed   By: Angus Bark M.D.   On: 02/25/2024 16:07     Signature  -   Lynnwood Sauer M.D on 02/27/2024 at 8:22 AM   -  To page go to [email or web address]

## 2024-02-27 NOTE — Plan of Care (Signed)
  Problem: Health Behavior/Discharge Planning: Goal: Ability to manage health-related needs will improve Outcome: Progressing   Problem: Clinical Measurements: Goal: Will remain free from infection Outcome: Progressing   Problem: Activity: Goal: Risk for activity intolerance will decrease Outcome: Progressing   Problem: Pain Managment: Goal: General experience of comfort will improve and/or be controlled Outcome: Progressing

## 2024-02-27 NOTE — Plan of Care (Signed)
  Problem: Clinical Measurements: Goal: Will remain free from infection Outcome: Progressing   Problem: Activity: Goal: Risk for activity intolerance will decrease Outcome: Progressing   Problem: Nutrition: Goal: Adequate nutrition will be maintained Outcome: Progressing   Problem: Elimination: Goal: Will not experience complications related to bowel motility Outcome: Progressing Goal: Will not experience complications related to urinary retention Outcome: Progressing   Problem: Pain Managment: Goal: General experience of comfort will improve and/or be controlled Outcome: Progressing   Problem: Safety: Goal: Ability to remain free from injury will improve Outcome: Progressing   Problem: Skin Integrity: Goal: Risk for impaired skin integrity will decrease Outcome: Progressing

## 2024-02-28 ENCOUNTER — Other Ambulatory Visit (HOSPITAL_COMMUNITY): Payer: Self-pay

## 2024-02-28 LAB — CBC WITH DIFFERENTIAL/PLATELET
Abs Immature Granulocytes: 0.08 10*3/uL — ABNORMAL HIGH (ref 0.00–0.07)
Basophils Absolute: 0.1 10*3/uL (ref 0.0–0.1)
Basophils Relative: 0 %
Eosinophils Absolute: 0.4 10*3/uL (ref 0.0–0.5)
Eosinophils Relative: 3 %
HCT: 29.6 % — ABNORMAL LOW (ref 36.0–46.0)
Hemoglobin: 9.8 g/dL — ABNORMAL LOW (ref 12.0–15.0)
Immature Granulocytes: 1 %
Lymphocytes Relative: 24 %
Lymphs Abs: 2.9 10*3/uL (ref 0.7–4.0)
MCH: 28.1 pg (ref 26.0–34.0)
MCHC: 33.1 g/dL (ref 30.0–36.0)
MCV: 84.8 fL (ref 80.0–100.0)
Monocytes Absolute: 1.1 10*3/uL — ABNORMAL HIGH (ref 0.1–1.0)
Monocytes Relative: 9 %
Neutro Abs: 7.5 10*3/uL (ref 1.7–7.7)
Neutrophils Relative %: 63 %
Platelets: 406 10*3/uL — ABNORMAL HIGH (ref 150–400)
RBC: 3.49 MIL/uL — ABNORMAL LOW (ref 3.87–5.11)
RDW: 12.6 % (ref 11.5–15.5)
WBC: 12 10*3/uL — ABNORMAL HIGH (ref 4.0–10.5)
nRBC: 0 % (ref 0.0–0.2)

## 2024-02-28 LAB — COMPREHENSIVE METABOLIC PANEL WITH GFR
ALT: 16 U/L (ref 0–44)
AST: 18 U/L (ref 15–41)
Albumin: 2.4 g/dL — ABNORMAL LOW (ref 3.5–5.0)
Alkaline Phosphatase: 70 U/L (ref 38–126)
Anion gap: 13 (ref 5–15)
BUN: 7 mg/dL (ref 6–20)
CO2: 21 mmol/L — ABNORMAL LOW (ref 22–32)
Calcium: 8.5 mg/dL — ABNORMAL LOW (ref 8.9–10.3)
Chloride: 102 mmol/L (ref 98–111)
Creatinine, Ser: 0.56 mg/dL (ref 0.44–1.00)
GFR, Estimated: >60 mL/min (ref >60)
Glucose, Bld: 92 mg/dL (ref 70–99)
Potassium: 3.8 mmol/L (ref 3.5–5.1)
Sodium: 136 mmol/L (ref 135–145)
Total Bilirubin: 0.4 mg/dL (ref 0.0–1.2)
Total Protein: 6.4 g/dL — ABNORMAL LOW (ref 6.5–8.1)

## 2024-02-28 LAB — PROCALCITONIN: Procalcitonin: <0.1 ng/mL

## 2024-02-28 LAB — C-REACTIVE PROTEIN: CRP: 15.2 mg/dL — ABNORMAL HIGH (ref <1.0)

## 2024-02-28 LAB — MAGNESIUM: Magnesium: 2 mg/dL (ref 1.7–2.4)

## 2024-02-28 LAB — PHOSPHORUS: Phosphorus: 3.9 mg/dL (ref 2.5–4.6)

## 2024-02-28 MED ORDER — HYDROMORPHONE HCL 2 MG PO TABS
1.0000 mg | ORAL_TABLET | Freq: Two times a day (BID) | ORAL | 0 refills | Status: DC | PRN
  Filled 2024-02-28: qty 15, 15d supply, fill #0

## 2024-02-28 MED ORDER — ONDANSETRON 4 MG PO TBDP
4.0000 mg | ORAL_TABLET | Freq: Three times a day (TID) | ORAL | 0 refills | Status: DC | PRN
  Filled 2024-02-28: qty 20, 7d supply, fill #0

## 2024-02-28 MED ORDER — HYDROCODONE-ACETAMINOPHEN 5-325 MG PO TABS
1.0000 | ORAL_TABLET | Freq: Four times a day (QID) | ORAL | 0 refills | Status: DC | PRN
  Filled 2024-02-28: qty 15, 4d supply, fill #0

## 2024-02-28 MED ORDER — ROSUVASTATIN CALCIUM 20 MG PO TABS
20.0000 mg | ORAL_TABLET | Freq: Every day | ORAL | 0 refills | Status: DC
  Filled 2024-02-28: qty 30, 30d supply, fill #0

## 2024-02-28 MED ORDER — MILK OF MAGNESIA 7.75 % PO SUSP
30.0000 mL | Freq: Every day | ORAL | 0 refills | Status: DC | PRN
  Filled 2024-02-28: qty 473, 15d supply, fill #0
  Filled 2024-02-28: qty 118, 3d supply, fill #0

## 2024-02-28 MED ORDER — DOCUSATE SODIUM 100 MG PO CAPS
100.0000 mg | ORAL_CAPSULE | Freq: Two times a day (BID) | ORAL | 0 refills | Status: DC | PRN
  Filled 2024-02-28: qty 30, 15d supply, fill #0

## 2024-02-28 MED ORDER — FENOFIBRATE 160 MG PO TABS
160.0000 mg | ORAL_TABLET | Freq: Every day | ORAL | 0 refills | Status: DC
  Filled 2024-02-28: qty 30, 30d supply, fill #0

## 2024-02-28 NOTE — Plan of Care (Signed)
  Problem: Clinical Measurements: Goal: Ability to maintain clinical measurements within normal limits will improve Outcome: Progressing Goal: Will remain free from infection Outcome: Progressing   Problem: Activity: Goal: Risk for activity intolerance will decrease Outcome: Progressing   Problem: Pain Managment: Goal: General experience of comfort will improve and/or be controlled Outcome: Progressing

## 2024-02-28 NOTE — Plan of Care (Signed)

## 2024-02-28 NOTE — Discharge Instructions (Signed)
 Follow with Primary MD Arleta Bench Freya Jesus, NP in 7 days   Get CBC, CMP, 2 view Chest X ray -  checked next visit with your primary MD    Activity: As tolerated with Full fall precautions use walker/cane & assistance as needed  Disposition Home   Diet: Low carbohydrate, low-fat diet for the next 7 to 10 days.  Then advance to heart healthy.  Special Instructions: If you have smoked or chewed Tobacco  in the last 2 yrs please stop smoking, stop any regular Alcohol  and or any Recreational drug use.  On your next visit with your primary care physician please Get Medicines reviewed and adjusted.  Please request your Prim.MD to go over all Hospital Tests and Procedure/Radiological results at the follow up, please get all Hospital records sent to your Prim MD by signing hospital release before you go home.  If you experience worsening of your admission symptoms, develop shortness of breath, life threatening emergency, suicidal or homicidal thoughts you must seek medical attention immediately by calling 911 or calling your MD immediately  if symptoms less severe.  You Must read complete instructions/literature along with all the possible adverse reactions/side effects for all the Medicines you take and that have been prescribed to you. Take any new Medicines after you have completely understood and accpet all the possible adverse reactions/side effects.   Do not drive when taking Pain medications.  Do not take more than prescribed Pain, Sleep and Anxiety Medications  Wear Seat belts while driving.

## 2024-02-28 NOTE — Discharge Summary (Signed)
 Judy Hughes ZOX:[id number] DOB: 01/05/2006 DOA: 02/24/2024  PCP: Rayann Cage, NP  Admit date: 02/24/2024  Discharge date: 02/28/2024  Admitted From: Home   Disposition:  Home   Recommendations for Outpatient Follow-up:   Follow up with PCP in 1-2 weeks  PCP Please obtain BMP/CBC, 2 view CXR in 1week,  (see Discharge instructions)   PCP Please follow up on the following pending results:    Home Health: None   Equipment/Devices: None  Consultations: GI Discharge Condition: Stable    CODE STATUS: Full    Diet Recommendation: Heart Healthy Low carb    Chief Complaint  Patient presents with   Abdominal Pain   Flank Pain    L     Brief history of present illness from the day of admission and additional interim summary     18 y.o. female, with history of PCOS, seasonal allergies, UTIs, migraine, lipidemia for which she has not taken a medication in few years comes to the hospital after experiencing sharp epigastric abdominal pain which started abruptly yesterday at home, pain is radiating to her back and associated with nausea and vomiting, worse with eating food better with bowel rest and pain medications, went to [facility] where workup was suggestive of acute pancreatitis and she was sent to [facility] for further admission.  Her LFTs were stable, she had reactive leukocytosis, CT scan confirmed pancreatitis with no CBD dilation or gallstones.                                                                  Hospital Course    Acute pancreatitis.  Unclear etiology, LFTs stable, CBD looks unremarkable on CT, no history of alcohol abuse, LDL is marginally high for which she has been placed on Tricor, negative IgG4, she was treated conservatively with bowel rest, IV fluids and pain control, pain much improved  tolerating soft diet for 24 hours and eager to go home.  Will be discharged home will follow-up with GI outpatient.  Was seen by  GI here.  Only potential trigger could have been her oral contraceptive pill, cleared by GI to continue taking it for now.   2.  Leukocytosis and thrombocytosis.  Reactive and improving.  PCP to monitor.   3.  History of allergies.  Supportive care.   4.  Dyslipidemia.  Marginally elevated so #1 LDL and triglycerides, low-dose statin and Tricor.   Discharge diagnosis     Principal Problem:   Acute pancreatitis    Discharge instructions    Discharge Instructions     Discharge instructions   Complete by: As directed    Follow with Primary MD Arleta Bench Freya Jesus, NP in 7 days   Get CBC, CMP, 2 view Chest X ray -  checked next visit with  your primary MD    Activity: As tolerated with Full fall precautions use walker/cane & assistance as needed  Disposition Home   Diet: Low carbohydrate, low-fat diet for the next 7 to 10 days.  Then advance to heart healthy.  Special Instructions: If you have smoked or chewed Tobacco  in the last 2 yrs please stop smoking, stop any regular Alcohol  and or any Recreational drug use.  On your next visit with your primary care physician please Get Medicines reviewed and adjusted.  Please request your Prim.MD to go over all Hospital Tests and Procedure/Radiological results at the follow up, please get all Hospital records sent to your Prim MD by signing hospital release before you go home.  If you experience worsening of your admission symptoms, develop shortness of breath, life threatening emergency, suicidal or homicidal thoughts you must seek medical attention immediately by calling 911 or calling your MD immediately  if symptoms less severe.  You Must read complete instructions/literature along with all the possible adverse reactions/side effects for all the Medicines you take and that have been prescribed to you. Take  any new Medicines after you have completely understood and accpet all the possible adverse reactions/side effects.   Do not drive when taking Pain medications.  Do not take more than prescribed Pain, Sleep and Anxiety Medications  Wear Seat belts while driving.   Increase activity slowly   Complete by: As directed        Discharge Medications   Allergies as of 02/28/2024       Reactions   Sulfa Antibiotics Rash        Medication List     STOP taking these medications    EPINEPHrine  0.3 mg/0.3 mL Soaj injection Commonly known as: EPI-PEN   ibuprofen  200 MG tablet Commonly known as: ADVIL    Qulipta  60 MG Tabs Generic drug: Atogepant    rizatriptan  10 MG disintegrating tablet Commonly known as: MAXALT -MLT       TAKE these medications    albuterol  108 (90 Base) MCG/ACT inhaler Commonly known as: VENTOLIN  HFA Inhale 2 puffs into the lungs every 6 (six) hours as needed for wheezing or shortness of breath.   amphetamine -dextroamphetamine  10 MG 24 hr capsule Commonly known as: Adderall XR Take 1 capsule (10 mg total) by mouth daily.   cetirizine  10 MG chewable tablet Commonly known as: ZYRTEC  Chew 10 mg by mouth daily.   docusate sodium  100 MG capsule Commonly known as: Colace Take 1 capsule (100 mg total) by mouth 2 (two) times daily as needed for mild constipation.   fenofibrate 160 MG tablet Take 1 tablet (160 mg total) by mouth daily.   fluconazole  150 MG tablet Commonly known as: DIFLUCAN  Take 1 tablet (150 mg total) by mouth once if symptoms and can repeat 1 tablet in 1 week if needed   HYDROcodone -acetaminophen  5-325 MG tablet Commonly known as: NORCO/VICODIN Take 1 tablet by mouth every 6 (six) hours as needed for moderate pain (pain score 4-6).   HYDROmorphone  2 MG tablet Commonly known as: Dilaudid  Take 0.5 tablets (1 mg total) by mouth every 12 (twelve) hours as needed for severe pain (pain score 7-10).   Isibloom  0.15-30 MG-MCG  tablet Generic drug: desogestrel -ethinyl estradiol  Take 1 tablet by mouth daily. (continuous) What changed:  when to take this additional instructions   Milk of Magnesia 7.75 % suspension Generic drug: magnesium  hydroxide Take 30 mLs by mouth daily as needed for moderate constipation.   MULTIVITAMIN PO Take 1  tablet by mouth daily.   ondansetron  4 MG disintegrating tablet Commonly known as: ZOFRAN -ODT Dissolve 1 tablet (4 mg total) in mouth every 8 (eight) hours as needed for nausea or vomiting.   rosuvastatin 20 MG tablet Commonly known as: CRESTOR Take 1 tablet (20 mg total) by mouth daily.   sertraline  100 MG tablet Commonly known as: ZOLOFT  Take 1 tablet (100 mg total) by mouth daily.         Follow-up Information     Klett, Freya Jesus, NP. Schedule an appointment as soon as possible for a visit in 1 week(s).   Specialty: Pediatrics Contact information: [address] [place] [address] [phone]         Ace Holder, MD. Schedule an appointment as soon as possible for a visit in 1 week(s).   Specialty: Gastroenterology Contact information: [address] [place] [address] [place] [address] [phone]                 Major procedures and Radiology Reports - PLEASE review detailed and final reports thoroughly  -     DG Chest Port 1 View Result Date: 02/25/2024 CLINICAL DATA:  Chest pain EXAM: PORTABLE CHEST 1 VIEW COMPARISON:  06/06/2015 FINDINGS: Low lung volumes accentuate pulmonary vascularity and cardiomediastinal silhouette. Left basilar airspace opacities and small left pleural effusion. No pneumothorax. No displaced rib fractures. IMPRESSION: Small left pleural effusion with associated atelectasis or pneumonia. Electronically Signed   By: Rozell Cornet M.D.   On: 02/25/2024 21:45   US  Abdomen Complete Result Date: 02/25/2024 CLINICAL DATA:  Pancreatitis. EXAM: ABDOMEN ULTRASOUND COMPLETE COMPARISON:  Abdominal ultrasound  dated 08/02/2012 and CT abdomen pelvis dated 02/24/2024. FINDINGS: Gallbladder: No gallstones or wall thickening visualized. No sonographic Murphy sign noted by sonographer. Common bile duct: Diameter: 3 mm. Liver: No focal lesion identified. Within normal limits in parenchymal echogenicity. Portal vein is patent on color Doppler imaging with normal direction of blood flow towards the liver. IVC: No abnormality visualized. Pancreas: The pancreas is poorly visualized and obscured by bowel gas. Spleen: Size and appearance within normal limits. Right Kidney: Length: 12 cm. Echogenicity within normal limits. No mass or hydronephrosis visualized. Left Kidney: Length: 14 cm. Echogenicity within normal limits. No mass or hydronephrosis visualized. Abdominal aorta: No aneurysm visualized. Other findings: None. IMPRESSION: Unremarkable abdominal ultrasound.  No gallstone. Electronically Signed   By: Angus Bark M.D.   On: 02/25/2024 16:07   CT ABDOMEN PELVIS W CONTRAST Result Date: 02/24/2024 EXAM: CT ABDOMEN AND PELVIS WITH CONTRAST 02/24/2024 01:41:04 AM TECHNIQUE: CT of the abdomen and pelvis was performed with the administration of intravenous contrast. Multiplanar reformatted images are provided for review. Automated exposure control, iterative reconstruction, and/or weight based adjustment of the mA/kV was utilized to reduce the radiation dose to as low as reasonably achievable. COMPARISON: None available. CLINICAL HISTORY: Abdominal pain, acute, nonlocalized. Pt c/o abd pain, NV onset "earlier today, kept getting worse. Thought it was trapped gas or something- took gas pills/ pepto pills." L side/ flank pain. Pt w familial hx of kidney stones. FINDINGS: LOWER CHEST: No acute abnormality. LIVER: The liver is unremarkable. GALLBLADDER AND BILE DUCTS: Gallbladder is unremarkable. No biliary ductal dilatation. SPLEEN: No acute abnormality. PANCREAS: Peripancreatic fluid/inflammatory changes along the pancreatic  tail image 27, suggesting acute pancreatitis. No walled-off fluid collection/pseudocyst. ADRENAL GLANDS: No acute abnormality. KIDNEYS, URETERS AND BLADDER: No stones in the kidneys or ureters. No hydronephrosis. No perinephric or periureteral stranding. Urinary bladder  is unremarkable. GI AND BOWEL: Stomach demonstrates no acute abnormality. There is no bowel obstruction. No appendicitis. Normal appendix (image 70). PERITONEUM AND RETROPERITONEUM: No ascites. No free air. VASCULATURE: Aorta is normal in caliber. LYMPH NODES: No lymphadenopathy. REPRODUCTIVE ORGANS: No acute abnormality. BONES AND SOFT TISSUES: No acute osseous abnormality. No focal soft tissue abnormality. IMPRESSION: 1. Acute pancreatitis, as above. No walled-off fluid collection/pseudocyst. Electronically signed by: Zadie Herter MD 02/24/2024 01:44 AM EDT RP Workstation: [id number]    Micro Results    No results found for this or any previous visit (from the past 240 hours).  Today   Subjective    Judy Hughes today has no headache,no chest pain, ++ improved abdominal pain,no new weakness tingling or numbness, feels much better wants to go home today.    Objective   Blood pressure 127/74, pulse 99, temperature 98.1 F (36.7 C), temperature source Oral, resp. rate (!) 25, SpO2 97%.   Intake/Output Summary (Last 24 hours) at 02/28/2024 0933 Last data filed at 02/27/2024 1534 Gross per 24 hour  Intake 941.67 ml  Output --  Net 941.67 ml    Exam  Awake Alert, No new F.N deficits,    [place].AT,PERRAL Supple Neck,   Symmetrical Chest wall movement, Good air movement bilaterally, CTAB RRR,No Gallops,   +ve B.Sounds, Abd Soft, Non tender,  No Cyanosis, Clubbing or edema    Data Review   Recent Labs  Lab 02/24/24 1700 02/25/24 0556 02/26/24 0601 02/27/24 0400 02/28/24 0733  WBC 18.2* 16.1* 16.0* 15.0* 12.0*  HGB 12.1 10.5* 9.5* 9.9* 9.8*  HCT 35.2* 31.6* 29.4* 30.8* 29.6*  PLT 379 326 329 420* 406*  MCV  83.8 85.4 87.0 86.5 84.8  MCH 28.8 28.4 28.1 27.8 28.1  MCHC 34.4 33.2 32.3 32.1 33.1  RDW 13.1 13.2 13.1 12.8 12.6  LYMPHSABS  --  2.1 2.2 2.7 2.9  MONOABS  --  1.0 1.3* 1.0 1.1*  EOSABS  --  0.1 0.2 0.3 0.4  BASOSABS  --  0.1 0.0 0.1 0.1    Recent Labs  Lab 02/24/24 0040 02/24/24 0446 02/24/24 0515 02/24/24 0628 02/24/24 1700 02/25/24 0556 02/25/24 2206 02/26/24 0601 02/27/24 0400 02/28/24 0733  NA 133*  --    < > 135  --  134*  --  133* 136 136  K 4.1  --    < > 3.6  --  4.1  --  3.7 3.7 3.8  CL 96*  --   --  104  --  103  --  101 103 102  CO2 <7*  --   --  17*  --  22  --  21* 23 21*  ANIONGAP NOT CALCULATED  --   --  15  --  9  --  11 10 13   GLUCOSE 115*  --   --  107*  --  92  --  82 96 92  BUN 7  --   --  5*  --  5*  --  <5* <5* 7  CREATININE 0.62  --   --  0.42* 0.48 0.61  --  0.53 0.67 0.56  AST 19  --   --   --   --  12*  --  11* 17 18  ALT 19  --   --   --   --  14  --  10 13 16   ALKPHOS 90  --   --   --   --  49  --  50 53 70  BILITOT <0.2  --   --   --   --  0.9  --  1.0 0.8 0.4  ALBUMIN 4.1  --   --   --   --  2.4*  --  2.3* 2.5* 2.4*  CRP  --   --   --   --  13.9*  --   --   --  23.8* 15.2*  PROCALCITON  --   --   --   --   --   --   --   --  <0.10  --   LATICACIDVEN  --  1.6  --  1.1  --   --  1.4  --   --   --   INR  --   --   --   --   --  1.1  --   --   --   --   HGBA1C  --   --   --   --  4.6*  --   --   --   --   --   MG  --   --   --   --   --  1.7  --  1.8 1.8 2.0  PHOS  --   --   --   --   --  3.4  --  2.5 3.3 3.9  CALCIUM 9.6  --   --  7.9*  --  8.6*  --  8.3* 8.8* 8.5*   < > = values in this interval not displayed.    Total Time in preparing paper work, data evaluation and todays exam - 35 minutes  Signature  -    Lynnwood Sauer M.D on 02/28/2024 at 9:33 AM   -  To page go to [email or web address]

## 2024-02-28 NOTE — TOC Transition Note (Signed)
 Transition of Care [facility]) - Discharge Note   Patient Details  Name: Judy Hughes MRN: [id number] Date of Birth: 02-11-2006  [facility] CM/SW Contact:  Eusebio High, RN Phone Number: 02/28/2024, 9:43 AM   Clinical Narrative:     Patient will DC to home today. No TOC needs identified. Patient will follow up as directed on AVS. Family to transport           Patient Goals and CMS Choice            Discharge Placement                       Discharge Plan and Services Additional resources added to the After Visit Summary for                                       Social Drivers of Health (SDOH) Interventions SDOH Screenings   Food Insecurity: No Food Insecurity (02/25/2024)  Housing: Low Risk  (02/25/2024)  Transportation Needs: No Transportation Needs (02/25/2024)  Utilities: Not At Risk (02/25/2024)  Depression (PHQ2-9): Medium Risk (05/09/2023)  Tobacco Use: Medium Risk (02/23/2024)     Readmission Risk Interventions     No data to display

## 2024-03-01 ENCOUNTER — Other Ambulatory Visit (HOSPITAL_COMMUNITY): Payer: Self-pay

## 2024-03-01 ENCOUNTER — Ambulatory Visit (INDEPENDENT_AMBULATORY_CARE_PROVIDER_SITE_OTHER): Admitting: Pediatrics

## 2024-03-01 ENCOUNTER — Telehealth: Payer: Self-pay | Admitting: Pediatrics

## 2024-03-01 VITALS — Wt 201.6 lb

## 2024-03-01 DIAGNOSIS — J90 Pleural effusion, not elsewhere classified: Secondary | ICD-10-CM

## 2024-03-01 DIAGNOSIS — K859 Acute pancreatitis without necrosis or infection, unspecified: Secondary | ICD-10-CM

## 2024-03-01 DIAGNOSIS — Z09 Encounter for follow-up examination after completed treatment for conditions other than malignant neoplasm: Secondary | ICD-10-CM | POA: Diagnosis not present

## 2024-03-01 HISTORY — DX: Pleural effusion, not elsewhere classified: J90

## 2024-03-01 NOTE — Telephone Encounter (Signed)
 Made in error

## 2024-03-01 NOTE — Patient Instructions (Signed)
 Return on Monday for labs Chest xray at [facility] [address] on Monday Follow up as needed  At [facility] we value your feedback. You may receive a survey about your visit today. Please share your experience as we strive to create trusting relationships with our patients to provide genuine, compassionate, quality care.

## 2024-03-05 ENCOUNTER — Encounter: Payer: Self-pay | Admitting: Pediatrics

## 2024-03-05 ENCOUNTER — Ambulatory Visit
Admission: RE | Admit: 2024-03-05 | Discharge: 2024-03-05 | Disposition: A | Discharge status: Home / Self Care | Source: Ambulatory Visit | Attending: Pediatrics

## 2024-03-05 ENCOUNTER — Ambulatory Visit (INDEPENDENT_AMBULATORY_CARE_PROVIDER_SITE_OTHER): Payer: Self-pay | Admitting: Pediatrics

## 2024-03-05 DIAGNOSIS — K859 Acute pancreatitis without necrosis or infection, unspecified: Secondary | ICD-10-CM

## 2024-03-05 DIAGNOSIS — J90 Pleural effusion, not elsewhere classified: Secondary | ICD-10-CM | POA: Diagnosis not present

## 2024-03-05 NOTE — Progress Notes (Signed)
 Judy Hughes is a 18 year old young woman here with her mother for hospital discharge follow up. She was admitted 02/24/2024 for a 4 day admission due to left sided abdominal pain and left sided flank pain with a diagnosis of acute pancreatitis with unknown cause. On day 2 of admission, CXR was ordered due to the new development of chest pain. CXR showed small left pleural effusion. Judy Hughes was discharged home on 02/28/2024 with some improvement in symptoms though continues to have left abdominal pain/tenderness. She is on a low fat diet and will follow up with GI. Discharging provider recommended repeating CBC, CMP, and 2 view CXR 7 days post-discharge. Today is day 2 post-discharge.   Review of Systems  Constitutional:  Positive for  appetite change.  HENT:  Negative for nasal and ear discharge.   Eyes: Negative for discharge, redness and itching.  Respiratory:  Negative for cough and wheezing.   Cardiovascular: Negative.  Gastrointestinal: Negative for vomiting and positive for diarrhea. Positive for left upper and lower quadrant pain/tenderness Musculoskeletal: Negative for arthralgias.  Skin: Negative for rash.  Neurological: Negative       Objective:   Physical Exam  Constitutional: Appears well-developed and well-nourished.   HENT:  Ears: Both TM's normal Nose: No nasal discharge.  Mouth/Throat: Mucous membranes are moist. .  Eyes: Pupils are equal, round, and reactive to light.  Neck: Normal range of motion..  Cardiovascular: Regular rhythm.  No murmur heard. Pulmonary/Chest: Effort normal and breath sounds normal. No wheezes with  no retractions.  Abdominal: Soft. Bowel sounds are normal. Tenderness with palpation in left upper and lower quadrants, no tenderness with palpation in right upper and lower quadrants Musculoskeletal: Normal range of motion.  Neurological: Active and alert.  Skin: Skin is warm and moist. No rash noted.       Assessment:      Hospital discharge follow  up Acute pancreatitis Left pleural effusion  Plan:     Continue low fat diet  Keep appointment with GI Return to office in a few days for blood work, todays appointment is too early for follow up labs/CXR Appointment scheduled for blood draw, CXR ordered. Veona knows to wait 4 days before going for CXR. Follow up in office as needed

## 2024-03-06 LAB — CBC WITH DIFFERENTIAL/PLATELET
Absolute Lymphocytes: 4125 {cells}/uL — ABNORMAL HIGH (ref 850–3900)
Absolute Monocytes: 594 {cells}/uL (ref 200–950)
Basophils Absolute: 66 {cells}/uL (ref 0–200)
Basophils Relative: 0.6 %
Eosinophils Absolute: 374 {cells}/uL (ref 15–500)
Eosinophils Relative: 3.4 %
HCT: 35.4 % (ref 35.0–45.0)
Hemoglobin: 11.4 g/dL — ABNORMAL LOW (ref 11.7–15.5)
MCH: 28 pg (ref 27.0–33.0)
MCHC: 32.2 g/dL (ref 32.0–36.0)
MCV: 87 fL (ref 80.0–100.0)
MPV: 9.1 fL (ref 7.5–12.5)
Monocytes Relative: 5.4 %
Neutro Abs: 5841 {cells}/uL (ref 1500–7800)
Neutrophils Relative %: 53.1 %
Platelets: 580 10*3/uL — ABNORMAL HIGH (ref 140–400)
RBC: 4.07 10*6/uL (ref 3.80–5.10)
RDW: 12.3 % (ref 11.0–15.0)
Total Lymphocyte: 37.5 %
WBC: 11 10*3/uL — ABNORMAL HIGH (ref 3.8–10.8)

## 2024-03-06 LAB — COMPREHENSIVE METABOLIC PANEL WITH GFR
AG Ratio: 1.2 (calc) (ref 1.0–2.5)
ALT: 21 U/L (ref 5–32)
AST: 15 U/L (ref 12–32)
Albumin: 4.1 g/dL (ref 3.6–5.1)
Alkaline phosphatase (APISO): 65 U/L (ref 36–128)
BUN: 8 mg/dL (ref 7–20)
CO2: 22 mmol/L (ref 20–32)
Calcium: 9.3 mg/dL (ref 8.9–10.4)
Chloride: 104 mmol/L (ref 98–110)
Creat: 0.54 mg/dL (ref 0.50–0.96)
Globulin: 3.4 g/dL (ref 2.0–3.8)
Glucose, Bld: 77 mg/dL (ref 65–99)
Potassium: 4.4 mmol/L (ref 3.8–5.1)
Sodium: 138 mmol/L (ref 135–146)
Total Bilirubin: 0.3 mg/dL (ref 0.2–1.1)
Total Protein: 7.5 g/dL (ref 6.3–8.2)
eGFR: 136 mL/min/{1.73_m2} (ref >=60)

## 2024-03-06 LAB — TEST AUTHORIZATION

## 2024-03-06 LAB — EXTRA LAV TOP TUBE

## 2024-03-07 NOTE — Progress Notes (Signed)
 Judy Hughes is a 18 year old young woman here for repeat blood work after hospital discharge. CXR results were given to patient when in the office- negative for left pleural effusion.   CBC and CMP per orders. Will call mother, per Symone's request, with results. Mother aware.

## 2024-03-09 ENCOUNTER — Other Ambulatory Visit (HOSPITAL_COMMUNITY): Payer: Self-pay

## 2024-03-19 ENCOUNTER — Other Ambulatory Visit (HOSPITAL_COMMUNITY): Payer: Self-pay

## 2024-03-20 ENCOUNTER — Other Ambulatory Visit: Payer: Self-pay | Admitting: *Deleted

## 2024-03-20 ENCOUNTER — Other Ambulatory Visit

## 2024-03-20 ENCOUNTER — Encounter: Payer: Self-pay | Admitting: Physician Assistant

## 2024-03-20 ENCOUNTER — Ambulatory Visit (INDEPENDENT_AMBULATORY_CARE_PROVIDER_SITE_OTHER): Admitting: Physician Assistant

## 2024-03-20 VITALS — BP 136/76 | HR 100 | Ht 64.0 in | Wt 204.0 lb

## 2024-03-20 DIAGNOSIS — K859 Acute pancreatitis without necrosis or infection, unspecified: Secondary | ICD-10-CM | POA: Diagnosis not present

## 2024-03-20 DIAGNOSIS — E781 Pure hyperglyceridemia: Secondary | ICD-10-CM | POA: Diagnosis not present

## 2024-03-20 DIAGNOSIS — D72829 Elevated white blood cell count, unspecified: Secondary | ICD-10-CM | POA: Diagnosis not present

## 2024-03-20 DIAGNOSIS — D696 Thrombocytopenia, unspecified: Secondary | ICD-10-CM

## 2024-03-20 NOTE — Patient Instructions (Signed)
 You have been scheduled for a CT scan of the abdomen and pelvis at [facility], 1st floor Radiology. You are scheduled on Wednesday 04/04/24 at 12:30 pm. You should arrive 15 minutes prior to your appointment time for registration.    Please follow the written instructions below on the day of your exam:   1) Do not eat anything after 10:30 am (4 hours prior to your test)    You may take any medications as prescribed with a small amount of water, if necessary. If you take any of the following medications: METFORMIN, GLUCOPHAGE, GLUCOVANCE, AVANDAMET, RIOMET, FORTAMET, ACTOPLUS MET, JANUMET, GLUMETZA or METAGLIP, you MAY be asked to HOLD this medication 48 hours AFTER the exam.   The purpose of you drinking the oral contrast is to aid in the visualization of your intestinal tract. The contrast solution may cause some diarrhea. Depending on your individual set of symptoms, you may also receive an intravenous injection of x-ray contrast/dye. Plan on being at [facility] for 45 minutes or longer, depending on the type of exam you are having performed.   If you have any questions regarding your exam or if you need to reschedule, you may call [facility] at [phone] between the hours of 8:00 am and 5:00 pm, Monday-Friday.

## 2024-03-20 NOTE — Progress Notes (Signed)
 Agree with assessment with the following thoughts: She was treated for hypertriglyceridemia however I do not think that was the cause of her pancreatitis, usually needs to be higher level than what she had.  We discussed that OCPs can increase the risk for pancreatitis however she wanted to resume it.  She should abstain from alcohol.  Judy Hughes I would recommend doing an MRCP as the follow-up exam, if you can change the CT scan to MRCP.  Try to minimize amount of radiation she gets.  Thanks

## 2024-03-20 NOTE — Progress Notes (Signed)
 Judy Console, PA-C [address] [place], [place]  [address] Phone: [phone]   Gastroenterology Consultation  Referring Provider:     Belenda Macario HERO, NP Primary Care Physician:  Belenda Macario HERO, NP Primary Gastroenterologist:  Judy Console, PA-C / Elspeth Naval, MD  Reason for Consultation:     Hospital follow-up of pancreatitis        HPI:   Judy Hughes is a 18 y.o. y/o female referred for consultation & management  by Belenda Macario HERO, NP.  She is here today with her mother.  Patient was admitted to [facility] 02/24/2024 until 02/28/2024 for acute pancreatitis.  Initial lipase 138.  Unclear etiology.  LFTs normal.  CBD looked unremarkable on CT.  Moderately elevated total cholesterol 243, triglyceride 391.  She was started on Crestor  and Tricor .  Negative IgG4. She was treated conservatively with bowel rest, IV fluids and pain control.  Pain much improved. Follow-up with GI outpatient.  Was seen by [facility] in hospital.  Only potential trigger could have been her oral contraceptive pill, cleared by GI to continue taking.  Had mild leukocytosis and thrombocytosis during hospitalization.  Chest x-ray showed small left pleural effusion.   Patient followed up with PCP 03/05/2024 who ordered repeat chest x-ray and labs for follow-up.  Chest x-ray showed resolution of pleural effusion.  Repeat labs showed white count improved from 16 down to 11.  Hemoglobin improved from 9.5 up to 11.4.  Continued to have elevated platelet count 580.  Normal LFTs.  Current Symptoms: She has not had any more upper abdominal pain, nausea, or vomiting since hospital discharge.  Currently feeling a lot better with no GI symptoms.  Denies diarrhea or constipation.  PMH: ADHD, depression, anxiety, menorrhagia, hyperlipidemia, PCOS, migraines, GERD.  Patient just finished her freshman year at [employer], majoring in psychology.  She admits to drinking moderate alcohol her freshman year.  Also admits to  marijuana use.       Component Ref Range & Units (hover) 3 wk ago 2 yr ago 4 yr ago  Cholesterol 243 High  184 High  R 223 High  R  Triglycerides 391 High  343 High  R, CM 224 High  R, CM  HDL 48 62 R 43 Low  R  Total CHOL/HDL Ratio 5.1 3.0 R 5.2 High  R  VLDL 78 High     LDL Cholesterol 117 High         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 Authorizing Provider  albuterol  (VENTOLIN  HFA) 108 (90 Base) MCG/ACT inhaler Inhale 2 puffs into the lungs every 6 (six) hours as needed for wheezing or shortness of breath. 12/31/22   Tysinger, Alm RAMAN, PA-C  amphetamine -dextroamphetamine  (ADDERALL XR) 10 MG 24 hr capsule Take 1 capsule (10 mg total) by  mouth daily. 06/10/23 09/12/23  Belenda Macario HERO, NP  cetirizine  (ZYRTEC ) 10 MG chewable tablet Chew 10 mg by mouth daily.    [provider]  desogestrel -ethinyl estradiol  (ISIBLOOM ) 0.15-30 MG-MCG tablet Take 1 tablet by mouth daily. (continuous) Patient taking differently: Take 1 tablet by mouth every evening. 10/03/23 06/21/24  Klett, Macario HERO, NP  docusate sodium  (COLACE) 100 MG capsule Take 1 capsule (100 mg total) by mouth 2 (two) times daily as needed for mild constipation. 02/28/24 03/29/24  Singh, Prashant K, MD  fenofibrate  160 MG tablet Take 1 tablet (160 mg total) by mouth daily. 02/28/24   Singh,  Prashant K, MD  fluconazole  (DIFLUCAN ) 150 MG tablet Take 1 tablet (150 mg total) by mouth once if symptoms and can repeat 1 tablet in 1 week if needed Patient not taking: Reported on 02/24/2024 09/27/23   Tysinger, Alm RAMAN, PA-C  HYDROcodone -acetaminophen  (NORCO/VICODIN) 5-325 MG tablet Take 1 tablet by mouth every 6 (six) hours as needed for moderate pain (pain score 4-6). 02/28/24   Singh, Prashant K, MD  HYDROmorphone  (DILAUDID ) 2 MG tablet Take 1/2 tablet (1 mg total) by mouth every 12 (twelve) hours as needed for severe pain (pain score 7-10). 02/28/24   Singh, Prashant K, MD  magnesium  hydroxide (MILK OF MAGNESIA) 400 MG/5ML suspension Take 30 mLs by mouth daily as needed for moderate constipation. 02/28/24 02/27/25  Singh, Prashant K, MD  Multiple Vitamin (MULTIVITAMIN PO) Take 1 tablet by mouth daily.    [provider]  ondansetron  (ZOFRAN -ODT) 4 MG disintegrating tablet Dissolve 1 tablet (4 mg total) in mouth every 8 (eight) hours as needed for nausea or vomiting. 02/28/24   Dennise Lavada POUR, MD  rosuvastatin  (CRESTOR ) 20 MG tablet Take 1 tablet (20 mg total) by mouth daily. 02/28/24   Singh, Prashant K, MD  sertraline  (ZOLOFT ) 100 MG tablet Take 1 tablet (100 mg total) by mouth daily. 02/22/24   Klett, Macario HERO, NP    Family History  Problem Relation Age of Onset   Other Mother        +anticardiolipin antibody   Migraines Mother    Cholelithiasis Father    Depression Sister    Depression Brother    Bipolar disorder Brother    Other Brother        chronic back pain   Diabetes Maternal Aunt    Diabetes Paternal Uncle    Heart disease Maternal Grandmother        MI, CAD, died at 12, new MI   Cancer Maternal Grandfather 21       colorectal   Diabetes Maternal Grandfather    Celiac disease Neg Hx    Stroke Neg Hx      Social History   Tobacco Use   Smoking status: Never    Passive exposure: Current   Smokeless tobacco: Never   Tobacco comments:    second hand smoke  exposure  Vaping Use   Vaping status: Never Used  Substance Use Topics   Alcohol use: No   Drug use: No    Allergies as of 03/20/2024 - Review Complete 03/20/2024  Allergen Reaction Noted   Sulfa antibiotics Rash 03/10/2011    Review of Systems:    All systems reviewed and negative except where noted in HPI.   Physical Exam:  BP 136/76   Pulse 100   Ht 5' 4 (1.626 m)   Wt 204 lb (92.5 kg)   LMP  (LMP Unknown)   BMI 35.02 kg/m  No LMP recorded (lmp unknown). (Menstrual status: Oral contraceptives).  General:   Alert,  Well-developed, well-nourished, pleasant and cooperative in NAD Lungs:  Respirations even and unlabored.  Clear throughout to auscultation.   No wheezes, crackles, or rhonchi. No acute distress. Heart:  Regular rate and rhythm; no murmurs, clicks, rubs, or gallops. Abdomen:  Normal bowel sounds.  No bruits.  Soft, and non-distended without masses, hepatosplenomegaly or hernias noted.  No Tenderness.  No guarding or rebound tenderness.    Neurologic:  Alert and oriented x3;  grossly normal neurologically. Psych:  Alert and cooperative. Normal mood and affect.  Imaging Studies: DG Chest 2 View Result Date: 03/05/2024 CLINICAL DATA:  Follow up pleural effusion EXAM: CHEST - 2 VIEW COMPARISON:  X-ray 02/25/2024 FINDINGS: No consolidation, pneumothorax or effusion. Normal cardiopericardial silhouette without edema. Previous left effusion no longer seen. IMPRESSION: No acute cardiopulmonary disease. Electronically Signed   By: Ranell Bring M.D.   On: 03/05/2024 15:40   DG Chest Port 1 View Result Date: 02/25/2024 CLINICAL DATA:  Chest pain EXAM: PORTABLE CHEST 1 VIEW COMPARISON:  06/06/2015 FINDINGS: Low lung volumes accentuate pulmonary vascularity and cardiomediastinal silhouette. Left basilar airspace opacities and small left pleural effusion. No pneumothorax. No displaced rib fractures. IMPRESSION: Small left pleural effusion with associated atelectasis or pneumonia.  Electronically Signed   By: Norman Gatlin M.D.   On: 02/25/2024 21:45   US  Abdomen Complete Result Date: 02/25/2024 CLINICAL DATA:  Pancreatitis. EXAM: ABDOMEN ULTRASOUND COMPLETE COMPARISON:  Abdominal ultrasound dated 08/02/2012 and CT abdomen pelvis dated 02/24/2024. FINDINGS: Gallbladder: No gallstones or wall thickening visualized. No sonographic Murphy sign noted by sonographer. Common bile duct: Diameter: 3 mm. Liver: No focal lesion identified. Within normal limits in parenchymal echogenicity. Portal vein is patent on color Doppler imaging with normal direction of blood flow towards the liver. IVC: No abnormality visualized. Pancreas: The pancreas is poorly visualized and obscured by bowel gas. Spleen: Size and appearance within normal limits. Right Kidney: Length: 12 cm. Echogenicity within normal limits. No mass or hydronephrosis visualized. Left Kidney: Length: 14 cm. Echogenicity within normal limits. No mass or hydronephrosis visualized. Abdominal aorta: No aneurysm visualized. Other findings: None. IMPRESSION: Unremarkable abdominal ultrasound.  No gallstone. Electronically Signed   By: Vanetta Chou M.D.   On: 02/25/2024 16:07   CT ABDOMEN PELVIS W CONTRAST Result Date: 02/24/2024 EXAM: CT ABDOMEN AND PELVIS WITH CONTRAST 02/24/2024 01:41:04 AM TECHNIQUE: CT of the abdomen and pelvis was performed with the administration of intravenous contrast. Multiplanar reformatted images are provided for review. Automated exposure control, iterative reconstruction, and/or weight based adjustment of the mA/kV was utilized to reduce the radiation dose to as low as reasonably achievable. COMPARISON: None available. CLINICAL HISTORY: Abdominal pain, acute, nonlocalized. Pt c/o abd pain, NV onset earlier today, kept getting worse. Thought it was trapped gas or something- took gas pills/ pepto pills. L side/ flank pain. Pt w familial hx of kidney stones. FINDINGS: LOWER CHEST: No acute abnormality. LIVER:  The liver is unremarkable. GALLBLADDER AND BILE DUCTS: Gallbladder is unremarkable. No biliary ductal dilatation. SPLEEN: No acute abnormality. PANCREAS: Peripancreatic fluid/inflammatory changes along the pancreatic tail image 27, suggesting acute pancreatitis. No walled-off fluid collection/pseudocyst. ADRENAL GLANDS: No acute abnormality. KIDNEYS, URETERS AND BLADDER: No stones in the kidneys or ureters. No hydronephrosis. No perinephric or periureteral stranding. Urinary bladder is unremarkable. GI AND BOWEL: Stomach demonstrates no acute abnormality. There is no bowel obstruction. No appendicitis. Normal appendix (image 70). PERITONEUM AND RETROPERITONEUM: No ascites.  No free air. VASCULATURE: Aorta is normal in caliber. LYMPH NODES: No lymphadenopathy. REPRODUCTIVE ORGANS: No acute abnormality. BONES AND SOFT TISSUES: No acute osseous abnormality. No focal soft tissue abnormality. IMPRESSION: 1. Acute pancreatitis, as above. No walled-off fluid collection/pseudocyst. Electronically signed by: Pinkie Pebbles MD 02/24/2024 01:44 AM EDT RP Workstation: [id number]    Labs: CBC    Component Value Date/Time   WBC 11.0 (H) 03/05/2024 1638   RBC 4.07 03/05/2024 1638   HGB 11.4 (L) 03/05/2024 1638   HGB 11.6 11/22/2019 1300   HCT 35.4 03/05/2024 1638   HCT 35.2 11/22/2019 1300   PLT 580 (H) 03/05/2024 1638   PLT 396 11/22/2019 1300   MCV 87.0 03/05/2024 1638   MCV 84 11/22/2019 1300   MCH 28.0 03/05/2024 1638   MCHC 32.2 03/05/2024 1638   RDW 12.3 03/05/2024 1638   RDW 12.8 11/22/2019 1300   LYMPHSABS 2.9 02/28/2024 0733   LYMPHSABS 2.7 11/22/2019 1300   MONOABS 1.1 (H) 02/28/2024 0733   EOSABS 374 03/05/2024 1638   EOSABS 0.2 11/22/2019 1300   BASOSABS 66 03/05/2024 1638   BASOSABS 0.0 11/22/2019 1300    CMP     Component Value Date/Time   NA 138 03/05/2024 1638   K 4.4 03/05/2024 1638   CL 104 03/05/2024 1638   CO2 22 03/05/2024 1638   GLUCOSE 77 03/05/2024 1638   BUN 8  03/05/2024 1638   CREATININE 0.54 03/05/2024 1638   CALCIUM  9.3 03/05/2024 1638   PROT 7.5 03/05/2024 1638   ALBUMIN 2.4 (L) 02/28/2024 0733   AST 15 03/05/2024 1638   ALT 21 03/05/2024 1638   ALKPHOS 70 02/28/2024 0733   BILITOT 0.3 03/05/2024 1638   GFRNONAA >60 02/28/2024 0733    Assessment and Plan:   Angelic Klingbeil is a 18 y.o. y/o female has been referred for hospital follow-up of acute pancreatitis, uncertain etiology.  LFTs stable.  Gallbladder and CBD  unremarkable on CT.  She has hypertriglyceridemia and hypercholesterolemia, recently started on Crestor  and Tricor .  She also admits to alcohol and marijuana use in the past year (freshman year of college).  Negative IgG4.  No evidence of autoimmune pancreatitis.    1.  Acute pancreatitis, uncertain etiology; possibly multifactorial due to hypertriglyceridemia, alcohol, and marijuana use. - I advised patient to avoid all alcohol and marijuana. - Lab: lipase, CMP - Plan for repeat CT scan to ensure resolution of pancreatitis (4 weeks after previous CT) - Continue low-fat diet. - Continue drinking 64 ounces of water daily  2.  Hypertriglyceridemia; Hypercholesterolemia - Continue Crestor  20 Mg daily and fenofibrate  160 Mg daily - Follow-up with PCP to monitor lipid panel.  3.  Leukocytosis and thrombocytosis -Lab: CBC   Follow up As Needed if recurrent Abdominal Pain or GI symptoms.  Also follow-up based on repeat CT and lab results.  Judy Console, PA-C

## 2024-03-21 ENCOUNTER — Other Ambulatory Visit: Payer: Self-pay

## 2024-03-21 DIAGNOSIS — K859 Acute pancreatitis without necrosis or infection, unspecified: Secondary | ICD-10-CM

## 2024-03-21 LAB — CBC WITH DIFFERENTIAL/PLATELET
Basophils Absolute: 0.1 10*3/uL (ref 0.0–0.2)
Basos: 1 %
EOS (ABSOLUTE): 0.4 10*3/uL (ref 0.0–0.4)
Eos: 5 %
Hematocrit: 34.3 % (ref 34.0–46.6)
Hemoglobin: 11.2 g/dL (ref 11.1–15.9)
Immature Grans (Abs): 0 10*3/uL (ref 0.0–0.1)
Immature Granulocytes: 0 %
Lymphocytes Absolute: 2.5 10*3/uL (ref 0.7–3.1)
Lymphs: 28 %
MCH: 28 pg (ref 26.6–33.0)
MCHC: 32.7 g/dL (ref 31.5–35.7)
MCV: 86 fL (ref 79–97)
Monocytes Absolute: 0.6 10*3/uL (ref 0.1–0.9)
Monocytes: 7 %
Neutrophils Absolute: 5.4 10*3/uL (ref 1.4–7.0)
Neutrophils: 59 %
Platelets: 296 10*3/uL (ref 150–450)
RBC: 4 x10E6/uL (ref 3.77–5.28)
RDW: 13 % (ref 11.7–15.4)
WBC: 9 10*3/uL (ref 3.4–10.8)

## 2024-03-21 LAB — COMPREHENSIVE METABOLIC PANEL WITH GFR
ALT: 15 IU/L (ref 0–32)
AST: 17 IU/L (ref 0–40)
Albumin: 4.1 g/dL (ref 4.0–5.0)
Alkaline Phosphatase: 66 IU/L (ref 42–106)
BUN/Creatinine Ratio: 16 (ref 9–23)
BUN: 8 mg/dL (ref 6–20)
Bilirubin Total: 0.2 mg/dL (ref 0.0–1.2)
CO2: 15 mmol/L — ABNORMAL LOW (ref 20–29)
Calcium: 9.5 mg/dL (ref 8.7–10.2)
Chloride: 108 mmol/L — ABNORMAL HIGH (ref 96–106)
Creatinine, Ser: 0.5 mg/dL — ABNORMAL LOW (ref 0.57–1.00)
Globulin, Total: 2.9 g/dL (ref 1.5–4.5)
Glucose: 116 mg/dL — ABNORMAL HIGH (ref 70–99)
Potassium: 4.2 mmol/L (ref 3.5–5.2)
Sodium: 140 mmol/L (ref 134–144)
Total Protein: 7 g/dL (ref 6.0–8.5)
eGFR: 138 mL/min/{1.73_m2} (ref >59)

## 2024-03-21 LAB — LIPASE: Lipase: 30 U/L (ref 14–72)

## 2024-03-21 NOTE — Progress Notes (Signed)
 CT cancelled and pt aware. Orders in for MR/MRCP. Pt given phone number [phone] to schedule appt on a date that works for her.

## 2024-03-22 ENCOUNTER — Telehealth: Payer: Self-pay

## 2024-03-22 ENCOUNTER — Ambulatory Visit: Payer: Self-pay | Admitting: Medical

## 2024-03-22 NOTE — Telephone Encounter (Signed)
 Spoke with pt and she is aware of results and recommendations per Ellouise Console PA

## 2024-03-22 NOTE — Telephone Encounter (Signed)
-----   Message from Ellouise Console sent at 03/22/2024  3:27 PM EDT ----- Regarding: Lab results Call and notify patient: Lipase lab result has returned to normal.  This is great news.  Liver test, kidney test, white count, and hemoglobin are all normal.  CBC, CMP, lipase normal.  Continue with current plan. Ellouise Console, PA-C ----- Message ----- From: Claudene Lucienne FALCON, RMA Sent: 03/22/2024  12:13 PM EDT To: Ellouise Console, PA-C  Labs on [employer] dob 10/04/2005

## 2024-03-22 NOTE — Progress Notes (Signed)
 I got these lab results.  I did not order these.  Please send to ordering physician  In general blood sugar was elevated but not sure if she was fasting.  Electrolytes okay.  Pancreas marker much improved.  Blood counts okay.

## 2024-03-27 ENCOUNTER — Ambulatory Visit (HOSPITAL_COMMUNITY)
Admission: RE | Admit: 2024-03-27 | Discharge: 2024-03-27 | Disposition: A | Discharge status: Home / Self Care | Source: Ambulatory Visit | Attending: Gastroenterology | Admitting: Gastroenterology

## 2024-03-27 DIAGNOSIS — K859 Acute pancreatitis without necrosis or infection, unspecified: Secondary | ICD-10-CM | POA: Diagnosis not present

## 2024-03-27 DIAGNOSIS — K838 Other specified diseases of biliary tract: Secondary | ICD-10-CM | POA: Diagnosis not present

## 2024-03-27 MED ORDER — GADOBUTROL 1 MMOL/ML IV SOLN
10.0000 mL | Freq: Once | INTRAVENOUS | Status: AC | PRN
  Administered 2024-03-27: 10 mL via INTRAVENOUS

## 2024-03-28 ENCOUNTER — Telehealth: Payer: Self-pay

## 2024-03-28 ENCOUNTER — Ambulatory Visit: Payer: Self-pay | Admitting: Gastroenterology

## 2024-03-28 NOTE — Telephone Encounter (Signed)
 Referral faxed to [facility] for hx of pancreatitis, gallbladder sludge, Consider for cholecystectomy

## 2024-04-02 ENCOUNTER — Other Ambulatory Visit: Payer: Self-pay | Admitting: Gastroenterology

## 2024-04-02 DIAGNOSIS — K859 Acute pancreatitis without necrosis or infection, unspecified: Secondary | ICD-10-CM

## 2024-04-04 ENCOUNTER — Ambulatory Visit (HOSPITAL_COMMUNITY)

## 2024-04-04 ENCOUNTER — Ambulatory Visit: Admitting: Physician Assistant

## 2024-04-13 ENCOUNTER — Other Ambulatory Visit: Payer: Self-pay | Admitting: Pediatrics

## 2024-04-13 ENCOUNTER — Other Ambulatory Visit (HOSPITAL_COMMUNITY): Payer: Self-pay

## 2024-04-13 MED ORDER — ROSUVASTATIN CALCIUM 20 MG PO TABS
20.0000 mg | ORAL_TABLET | Freq: Every day | ORAL | 0 refills | Status: AC
  Filled 2024-04-13: qty 90, 90d supply, fill #0

## 2024-04-17 ENCOUNTER — Other Ambulatory Visit (HOSPITAL_COMMUNITY): Payer: Self-pay

## 2024-04-23 ENCOUNTER — Other Ambulatory Visit (HOSPITAL_COMMUNITY): Payer: Self-pay

## 2024-04-24 DIAGNOSIS — K8020 Calculus of gallbladder without cholecystitis without obstruction: Secondary | ICD-10-CM | POA: Diagnosis not present

## 2024-04-24 NOTE — Progress Notes (Signed)
 REFERRING PHYSICIAN:  Leigh Elspeth Mt* PROVIDER:  DONNICE CARLIN BURY, MD MRN: [id number] DOB: [birth year] DATE OF ENCOUNTER: 04/24/2024 Subjective    Chief Complaint: New Consultation ( GALLBLADDER / NOTES IN [place]/ REF DR ELSPETH OAS)   History of Present Illness: This is a 18 year old female was recently admitted to the hospital with pancreatitis.  This was thought initially to be due to hypertriglyceridemia but that does not appear to be the case.  She underwent initial CT scan of her abdomen as well as an ultrasound that showed no evidence of any stones.  She resolved and then followed up with gastroenterology.  On July 9 she underwent an MRCP that showed some layering mild gallbladder sludge present.  She has had some nausea and an episode of emesis since the pancreatitis.  She is otherwise doing okay.  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  She is here with her mom.    Review of Systems: A complete review of systems was obtained from the patient.  I have reviewed this information and discussed as appropriate with the patient.  See HPI as well for other ROS.  Review of Systems  Gastrointestinal:  Positive for nausea.  Psychiatric/Behavioral:  The patient is nervous/anxious.   All other systems reviewed and are negative.    Medical History: History reviewed. No pertinent past medical history.  There is no problem list on file for this patient.   History reviewed. No pertinent surgical history.   Allergies  Allergen Reactions  . Sulfa (Sulfonamide Antibiotics) Rash    Current Outpatient Medications on File Prior to Visit  Medication Sig Dispense Refill  . dextroamphetamine -amphetamine  (ADDERALL XR) 10 MG XR capsule Take 10 mg by mouth once daily    . fenofibrate  160 MG tablet Take 160 mg by mouth once daily    . ISIBLOOM  0.15-0.03 mg tablet Take 1 tablet by mouth    . pediatric multivitamin with iron chewable tablet Take 1  tablet by mouth once daily     No current facility-administered medications on file prior to visit.    Family History  Problem Relation Age of Onset  . High blood pressure (Hypertension) Mother   . Hyperlipidemia (Elevated cholesterol) Mother   . High blood pressure (Hypertension) Father   . Hyperlipidemia (Elevated cholesterol) Father   . Deep vein thrombosis (DVT or abnormal blood clot formation) Father   . Hyperlipidemia (Elevated cholesterol) Sister      Social History   Tobacco Use  Smoking Status Never  Smokeless Tobacco Never     Social History   Socioeconomic History  . Marital status: Single  Tobacco Use  . Smoking status: Never  . Smokeless tobacco: Never  Vaping Use  . Vaping status: Unknown  Substance and Sexual Activity  . Alcohol use: Never  . Drug use: Never   Social Drivers of Health   Food Insecurity: No Food Insecurity (02/25/2024)   Received from [facility]   Hunger Vital Sign   . Within the past 12 months, you worried that your food would run out before you got the money to buy more.: Never true   . Within the past 12 months, the food you bought just didn't last and you didn't have money to get more.: Never true  Transportation Needs: No Transportation Needs (02/25/2024)   Received [facility] - [facility]   . Lack of Transportation (Medical): No   .  Lack of Transportation (Non-Medical): No    Objective:   Vitals:   04/24/24 1400 04/24/24 1403  BP: 112/78   Pulse: 102   Temp: 36.7 C (98.1 F)   SpO2: 98%   Weight: 92.2 kg (203 lb 3.2 oz)   Height: 162.6 cm (5' 4)   PainSc:  0-No pain  PainLoc:  Abdomen    Body mass index is 34.88 kg/m.  Physical Exam Vitals reviewed.  Constitutional:      Appearance: Normal appearance.  Eyes:     General: No scleral icterus. Abdominal:     General: There is no distension.     Palpations: Abdomen is soft.     Tenderness: There is no abdominal tenderness.  Neurological:      Mental Status: She is alert.        Assessment and Plan:     Diagnoses and all orders for this visit:  Calculus of gallbladder without cholecystitis without obstruction    Lap chole  I do think pancreatitis likely related to gallbladder. Discussed recurrence risk and indication for cholecystectomy I discussed the procedure in detail.  We discussed the risks and benefits of a laparoscopic cholecystectomy and possible cholangiogram including, but not limited to bleeding, infection, injury to surrounding structures such as the intestine or liver, bile leak, retained gallstones, need to convert to an open procedure, prolonged diarrhea, blood clots such as  DVT, common bile duct injury, anesthesia risks, and possible need for additional procedures. We also discussed small risk of subtotal cholecystectomy.  The likelihood of improvement in symptoms and return to the patient's normal status is good. We discussed the typical post-operative recovery course.     MATTHEW CARLIN BURY, MD

## 2024-05-05 ENCOUNTER — Other Ambulatory Visit: Payer: Self-pay

## 2024-05-05 ENCOUNTER — Inpatient Hospital Stay (HOSPITAL_BASED_OUTPATIENT_CLINIC_OR_DEPARTMENT_OTHER)
Admission: EM | Admit: 2024-05-05 | Discharge: 2024-05-07 | DRG: 419 | Disposition: A | Discharge status: Home / Self Care | Attending: Surgery | Admitting: Surgery

## 2024-05-05 ENCOUNTER — Emergency Department (HOSPITAL_BASED_OUTPATIENT_CLINIC_OR_DEPARTMENT_OTHER)

## 2024-05-05 ENCOUNTER — Encounter (HOSPITAL_BASED_OUTPATIENT_CLINIC_OR_DEPARTMENT_OTHER): Payer: Self-pay

## 2024-05-05 DIAGNOSIS — Z823 Family history of stroke: Secondary | ICD-10-CM

## 2024-05-05 DIAGNOSIS — K851 Biliary acute pancreatitis without necrosis or infection: Secondary | ICD-10-CM | POA: Diagnosis not present

## 2024-05-05 DIAGNOSIS — R0602 Shortness of breath: Secondary | ICD-10-CM | POA: Diagnosis present

## 2024-05-05 DIAGNOSIS — R109 Unspecified abdominal pain: Secondary | ICD-10-CM | POA: Diagnosis not present

## 2024-05-05 DIAGNOSIS — Z800 Family history of malignant neoplasm of digestive organs: Secondary | ICD-10-CM | POA: Diagnosis not present

## 2024-05-05 DIAGNOSIS — K219 Gastro-esophageal reflux disease without esophagitis: Secondary | ICD-10-CM | POA: Diagnosis present

## 2024-05-05 DIAGNOSIS — E282 Polycystic ovarian syndrome: Secondary | ICD-10-CM | POA: Diagnosis present

## 2024-05-05 DIAGNOSIS — E782 Mixed hyperlipidemia: Secondary | ICD-10-CM | POA: Diagnosis not present

## 2024-05-05 DIAGNOSIS — R1010 Upper abdominal pain, unspecified: Principal | ICD-10-CM

## 2024-05-05 DIAGNOSIS — F4323 Adjustment disorder with mixed anxiety and depressed mood: Secondary | ICD-10-CM | POA: Diagnosis present

## 2024-05-05 DIAGNOSIS — Z833 Family history of diabetes mellitus: Secondary | ICD-10-CM | POA: Diagnosis not present

## 2024-05-05 DIAGNOSIS — Z8249 Family history of ischemic heart disease and other diseases of the circulatory system: Secondary | ICD-10-CM | POA: Diagnosis not present

## 2024-05-05 DIAGNOSIS — F419 Anxiety disorder, unspecified: Secondary | ICD-10-CM | POA: Diagnosis not present

## 2024-05-05 DIAGNOSIS — Z79899 Other long term (current) drug therapy: Secondary | ICD-10-CM | POA: Diagnosis not present

## 2024-05-05 DIAGNOSIS — Z832 Family history of diseases of the blood and blood-forming organs and certain disorders involving the immune mechanism: Secondary | ICD-10-CM | POA: Diagnosis not present

## 2024-05-05 DIAGNOSIS — K859 Acute pancreatitis without necrosis or infection, unspecified: Secondary | ICD-10-CM | POA: Diagnosis not present

## 2024-05-05 DIAGNOSIS — Z9049 Acquired absence of other specified parts of digestive tract: Secondary | ICD-10-CM | POA: Diagnosis not present

## 2024-05-05 DIAGNOSIS — K811 Chronic cholecystitis: Secondary | ICD-10-CM | POA: Diagnosis present

## 2024-05-05 DIAGNOSIS — Z818 Family history of other mental and behavioral disorders: Secondary | ICD-10-CM | POA: Diagnosis not present

## 2024-05-05 DIAGNOSIS — Z882 Allergy status to sulfonamides status: Secondary | ICD-10-CM | POA: Diagnosis not present

## 2024-05-05 DIAGNOSIS — F909 Attention-deficit hyperactivity disorder, unspecified type: Secondary | ICD-10-CM | POA: Diagnosis present

## 2024-05-05 DIAGNOSIS — R748 Abnormal levels of other serum enzymes: Secondary | ICD-10-CM | POA: Diagnosis present

## 2024-05-05 DIAGNOSIS — K8010 Calculus of gallbladder with chronic cholecystitis without obstruction: Secondary | ICD-10-CM | POA: Diagnosis not present

## 2024-05-05 LAB — COMPREHENSIVE METABOLIC PANEL WITH GFR
ALT: 28 U/L (ref 0–44)
AST: 30 U/L (ref 15–41)
Albumin: 4 g/dL (ref 3.5–5.0)
Alkaline Phosphatase: 79 U/L (ref 38–126)
Anion gap: 18 — ABNORMAL HIGH (ref 5–15)
BUN: 7 mg/dL (ref 6–20)
CO2: 15 mmol/L — ABNORMAL LOW (ref 22–32)
Calcium: 9.4 mg/dL (ref 8.9–10.3)
Chloride: 102 mmol/L (ref 98–111)
Creatinine, Ser: 0.52 mg/dL (ref 0.44–1.00)
GFR, Estimated: >60 mL/min (ref >60)
Glucose, Bld: 98 mg/dL (ref 70–99)
Sodium: 135 mmol/L (ref 135–145)
Total Bilirubin: 0.2 mg/dL (ref 0.0–1.2)
Total Protein: 7.5 g/dL (ref 6.5–8.1)

## 2024-05-05 LAB — LIPASE, BLOOD
Lipase: 95 U/L — ABNORMAL HIGH (ref 11–51)
Lipase: 77 U/L — ABNORMAL HIGH (ref 11–51)

## 2024-05-05 LAB — URINALYSIS, ROUTINE W REFLEX MICROSCOPIC
Bilirubin Urine: NEGATIVE
Glucose, UA: NEGATIVE mg/dL
Hgb urine dipstick: NEGATIVE
Ketones, ur: NEGATIVE mg/dL
Leukocytes,Ua: NEGATIVE
Nitrite: NEGATIVE
Protein, ur: NEGATIVE mg/dL
Specific Gravity, Urine: 1.024 (ref 1.005–1.030)
pH: 6 (ref 5.0–8.0)

## 2024-05-05 LAB — CBC
HCT: 35.4 % — ABNORMAL LOW (ref 36.0–46.0)
Hemoglobin: 12.1 g/dL (ref 12.0–15.0)
MCH: 27.8 pg (ref 26.0–34.0)
MCHC: 34.2 g/dL (ref 30.0–36.0)
MCV: 81.4 fL (ref 80.0–100.0)
Platelets: 405 K/uL — ABNORMAL HIGH (ref 150–400)
RBC: 4.35 MIL/uL (ref 3.87–5.11)
RDW: 12.4 % (ref 11.5–15.5)
WBC: 18.3 K/uL — ABNORMAL HIGH (ref 4.0–10.5)
nRBC: 0 % (ref 0.0–0.2)

## 2024-05-05 LAB — PREGNANCY, URINE: Preg Test, Ur: NEGATIVE

## 2024-05-05 LAB — POTASSIUM: Potassium: 3.8 mmol/L (ref 3.5–5.1)

## 2024-05-05 MED ORDER — MORPHINE SULFATE (PF) 2 MG/ML IV SOLN
2.0000 mg | INTRAVENOUS | Status: DC | PRN
  Administered 2024-05-05 – 2024-05-06 (×4): 2 mg via INTRAVENOUS
  Filled 2024-05-05 (×4): qty 1

## 2024-05-05 MED ORDER — IOHEXOL 300 MG/ML  SOLN
100.0000 mL | Freq: Once | INTRAMUSCULAR | Status: AC | PRN
  Administered 2024-05-05: 100 mL via INTRAVENOUS

## 2024-05-05 MED ORDER — ACETAMINOPHEN 325 MG PO TABS: 650.0000 mg | ORAL_TABLET | Freq: Four times a day (QID) | ORAL | Status: DC | PRN

## 2024-05-05 MED ORDER — ENOXAPARIN SODIUM 40 MG/0.4ML IJ SOSY
40.0000 mg | PREFILLED_SYRINGE | INTRAMUSCULAR | Status: DC
  Administered 2024-05-05 – 2024-05-06 (×2): 40 mg via SUBCUTANEOUS
  Filled 2024-05-05 (×2): qty 0.4

## 2024-05-05 MED ORDER — ONDANSETRON HCL 4 MG/2ML IJ SOLN: 4.0000 mg | INTRAMUSCULAR | Status: DC | PRN

## 2024-05-05 MED ORDER — SODIUM CHLORIDE 0.9 % IV SOLN
2.0000 g | INTRAVENOUS | Status: DC
  Administered 2024-05-06 – 2024-05-07 (×2): 2 g via INTRAVENOUS
  Filled 2024-05-05 (×2): qty 20

## 2024-05-05 MED ORDER — LACTATED RINGERS IV BOLUS
1000.0000 mL | Freq: Once | INTRAVENOUS | Status: AC
  Administered 2024-05-05: 1000 mL via INTRAVENOUS

## 2024-05-05 MED ORDER — ONDANSETRON HCL 4 MG/2ML IJ SOLN
4.0000 mg | Freq: Once | INTRAMUSCULAR | Status: AC
  Administered 2024-05-05: 4 mg via INTRAVENOUS
  Filled 2024-05-05: qty 2

## 2024-05-05 MED ORDER — SODIUM CHLORIDE 0.9 % IV SOLN
2.0000 g | Freq: Once | INTRAVENOUS | Status: AC
  Administered 2024-05-05: 2 g via INTRAVENOUS
  Filled 2024-05-05: qty 20

## 2024-05-05 MED ORDER — SODIUM CHLORIDE 0.9 % IV SOLN: INTRAVENOUS | Status: DC

## 2024-05-05 MED ORDER — METOCLOPRAMIDE HCL 5 MG/ML IJ SOLN
10.0000 mg | Freq: Once | INTRAMUSCULAR | Status: AC
  Administered 2024-05-05: 10 mg via INTRAVENOUS
  Filled 2024-05-05: qty 2

## 2024-05-05 MED ORDER — ALBUTEROL SULFATE (2.5 MG/3ML) 0.083% IN NEBU
2.5000 mg | INHALATION_SOLUTION | RESPIRATORY_TRACT | Status: DC | PRN
Start: 2024-05-05 — End: 2024-05-07

## 2024-05-05 MED ORDER — HYDROMORPHONE HCL 1 MG/ML IJ SOLN
1.0000 mg | Freq: Once | INTRAMUSCULAR | Status: AC
  Administered 2024-05-05: 1 mg via INTRAVENOUS
  Filled 2024-05-05: qty 1

## 2024-05-05 MED ORDER — METOCLOPRAMIDE HCL 5 MG/ML IJ SOLN
10.0000 mg | Freq: Four times a day (QID) | INTRAMUSCULAR | Status: DC | PRN
  Administered 2024-05-05 – 2024-05-07 (×7): 10 mg via INTRAVENOUS
  Filled 2024-05-05 (×7): qty 2

## 2024-05-05 MED ORDER — ACETAMINOPHEN 650 MG RE SUPP: 650.0000 mg | Freq: Four times a day (QID) | RECTAL | Status: DC | PRN

## 2024-05-05 MED ORDER — TRAZODONE HCL 50 MG PO TABS
25.0000 mg | ORAL_TABLET | Freq: Every evening | ORAL | Status: DC | PRN
Start: 2024-05-05 — End: 2024-05-07
  Administered 2024-05-07: 25 mg via ORAL
  Filled 2024-05-05: qty 1

## 2024-05-05 NOTE — Consult Note (Signed)
 Reason for Consult:Biliary pancreatitis Referring Physician: Opyd, TRH  Judy Hughes is an 18 y.o. female.  HPI: This is a 18 year old female with a history of PCOS, migraine headaches, hyperlipidemia who presents after a recent hospitalization for pancreatitis.  Imaging showed no sign of bile duct dilatation or gallstones.  On 03/28/2024, she underwent an MRCP that showed some gallbladder sludge with no sign of choledocholithiasis or obstruction.  She was having minimal symptoms.  She was seen on 04/24/2024 by Dr. Ebbie who planned an elective cholecystectomy.  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  She presented to the emergency department earlier this morning with diffuse upper abdominal pain.  She was noted to have a mild elevation of her lipase into the 90s.  Her pain is located mostly in her epigastrium with radiation towards the left upper quadrant as well as the right upper quadrant.  CT scan showed some inflammation surround the pancreatic head and body consistent with acute pancreatitis.  No radiologic signs of acute cholecystitis.  She was transferred to [facility] for further treatment.  She is being admitted by the hospitalist.  We are consulted to discuss possible cholecystectomy.  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 08/02/2012   Urinary tract infection    x3 as of 6/12   Verruca vulgaris 05/30/2020    Past Surgical History:  Procedure Laterality Date   LACRIMAL TUBE INSERTION      Family History  Problem Relation Age of Onset   Other Mother         +anticardiolipin antibody   Migraines Mother    Cholelithiasis Father    Depression Sister    Depression Brother    Bipolar disorder Brother    Other Brother        chronic back pain   Diabetes Maternal Aunt    Diabetes Paternal Uncle    Heart disease Maternal Grandmother        MI, CAD, died at 63, new MI   Cancer Maternal Grandfather 45       colorectal   Diabetes Maternal Grandfather    Celiac disease Neg Hx    Stroke Neg Hx     Social History:  reports that she has never smoked. She has been exposed to tobacco smoke. She has never used smokeless tobacco. She reports that she does not drink alcohol and does not use drugs.  Allergies:  Allergies  Allergen Reactions   Sulfa Antibiotics Rash    Medications:  Prior to Admission medications   Medication Sig Start Date End Date Taking? Authorizing Provider  amphetamine -dextroamphetamine  (ADDERALL XR) 10 MG 24 hr capsule Take 1 capsule (10 mg total) by mouth daily. 06/10/23 05/05/24 Yes Klett, Macario HERO, NP  cetirizine  (ZYRTEC ) 10 MG tablet Take 10 mg by mouth daily.   Yes [provider]  desogestrel -ethinyl estradiol  (ISIBLOOM ) 0.15-30 MG-MCG tablet Take 1 tablet by mouth daily. (continuous) 10/03/23 06/21/24 Yes Klett, Macario HERO, NP  ondansetron  (ZOFRAN -ODT) 4 MG disintegrating tablet Dissolve 1 tablet (4 mg total) in mouth every 8 (eight) hours as needed for  nausea or vomiting. 02/28/24  Yes Dennise Lavada POUR, MD  rosuvastatin  (CRESTOR ) 20 MG tablet Take 1 tablet (20 mg total) by mouth daily. 04/13/24 07/16/24 Yes Klett, Macario HERO, NP  sertraline  (ZOLOFT ) 100 MG tablet Take 1 tablet (100 mg total) by mouth daily. 02/22/24  Yes Klett, Macario HERO, NP  fenofibrate  160 MG tablet Take 1 tablet (160 mg total) by mouth daily. Patient not taking: Reported on 05/05/2024 02/28/24   Singh, Prashant K, MD  Multiple Vitamin (MULTIVITAMIN PO) Take 1 tablet by mouth daily. Patient not taking: Reported on 05/05/2024    [provider]      Results for orders placed or performed during the hospital encounter of 05/05/24 (from the past 48 hours)  Urinalysis, Routine w reflex microscopic -Urine, Clean Catch     Status: None   Collection Time: 05/05/24 12:52 AM  Result Value Ref Range   Color, Urine YELLOW YELLOW   APPearance CLEAR CLEAR   Specific Gravity, Urine 1.024 1.005 - 1.030   pH 6.0 5.0 - 8.0   Glucose, UA NEGATIVE NEGATIVE mg/dL   Hgb urine dipstick NEGATIVE NEGATIVE   Bilirubin Urine NEGATIVE NEGATIVE   Ketones, ur NEGATIVE NEGATIVE mg/dL   Protein, ur NEGATIVE NEGATIVE mg/dL   Nitrite NEGATIVE NEGATIVE   Leukocytes,Ua NEGATIVE NEGATIVE    Comment: Performed at [facility], [address] [place], [place] [address]  Pregnancy, urine     Status: None   Collection Time: 05/05/24 12:52 AM  Result Value Ref Range   Preg Test, Ur NEGATIVE NEGATIVE    Comment:        THE SENSITIVITY OF THIS METHODOLOGY IS >20 mIU/mL. Performed at [facility], [address], [place], [place] [address]   Lipase, blood     Status: Abnormal   Collection Time: 05/05/24 12:57 AM  Result Value Ref Range   Lipase 95 (H) 11 - 51 U/L    Comment: Performed at [facility], [address], [place], [place] [address]  Comprehensive metabolic panel     Status: Abnormal   Collection Time: 05/05/24 12:57 AM  Result Value Ref Range   Sodium 135 135 - 145 mmol/L   Potassium HEMOLYSIS AT THIS LEVEL MAY AFFECT RESULT 3.5 - 5.1 mmol/L    Comment: Judy Rubenstein, RN [phone] 05/05/24 EO   Chloride 102 98 - 111 mmol/L   CO2 15 (L) 22 - 32 mmol/L   Glucose, Bld 98 70 - 99 mg/dL    Comment: Glucose reference range applies only to samples taken after fasting for at least 8 hours.   BUN 7 6 - 20 mg/dL   Creatinine, Ser 9.47 0.44 - 1.00 mg/dL   Calcium  9.4 8.9 - 10.3 mg/dL   Total Protein 7.5 6.5 - 8.1 g/dL   Albumin 4.0 3.5 - 5.0 g/dL   AST 30 15 - 41 U/L    Comment: HEMOLYSIS AT THIS  LEVEL MAY AFFECT RESULT   ALT 28 0 - 44 U/L   Alkaline Phosphatase 79 38 - 126 U/L   Total Bilirubin 0.2 0.0 - 1.2 mg/dL   GFR, Estimated >39 >39 mL/min    Comment: (NOTE) Calculated using the CKD-EPI Creatinine Equation (2021)    Anion gap 18 (H) 5 - 15    Comment: Performed at [facility], [address] [place], [place] [address]  CBC     Status: Abnormal   Collection Time: 05/05/24 12:57 AM  Result Value Ref Range  WBC 18.3 (H) 4.0 - 10.5 K/uL   RBC 4.35 3.87 - 5.11 MIL/uL   Hemoglobin 12.1 12.0 - 15.0 g/dL   HCT 64.5 (L) 63.9 - 53.9 %   MCV 81.4 80.0 - 100.0 fL   MCH 27.8 26.0 - 34.0 pg   MCHC 34.2 30.0 - 36.0 g/dL   RDW 87.5 88.4 - 84.4 %   Platelets 405 (H) 150 - 400 K/uL   nRBC 0.0 0.0 - 0.2 %    Comment: Performed at [facility], [address], [place], [place] [address]  Potassium     Status: None   Collection Time: 05/05/24  2:16 AM  Result Value Ref Range   Potassium 3.8 3.5 - 5.1 mmol/L    Comment: HEMOLYSIS AT THIS LEVEL MAY AFFECT RESULT Performed at [facility], [address], [place], [place] [address]     CT ABDOMEN PELVIS W CONTRAST Result Date: 05/05/2024 CLINICAL DATA:  Abdominal pain EXAM: CT ABDOMEN AND PELVIS WITH CONTRAST TECHNIQUE: Multidetector CT imaging of the abdomen and pelvis was performed using the standard protocol following bolus administration of intravenous contrast. RADIATION DOSE REDUCTION: This exam was performed according to the departmental dose-optimization program which includes automated exposure control, adjustment of the mA and/or kV according to patient size and/or use of iterative reconstruction technique. CONTRAST:  OMNIPAQUE  IOHEXOL  300 MG/ML  SOLN COMPARISON:  02/24/2024 FINDINGS: Lower chest: Dependent atelectasis in the left lower lobe. Hepatobiliary: No focal hepatic abnormality. Gallbladder unremarkable. Pancreas: No focal abnormality or ductal dilatation.  Inflammation surrounding the pancreatic head and body compatible with acute pancreatitis. Spleen: No focal abnormality.  Normal size. Adrenals/Urinary Tract: No adrenal abnormality. No focal renal abnormality. No stones or hydronephrosis. Urinary bladder is unremarkable. Stomach/Bowel: Normal appendix. Stomach, large and small bowel grossly unremarkable. Vascular/Lymphatic: No evidence of aneurysm or adenopathy. Reproductive: Uterus and adnexa unremarkable.  No mass. Other: Trace free fluid in the pelvis.  No free air. Musculoskeletal: No acute bony abnormality. IMPRESSION: Edema/inflammation surrounding the pancreatic head and body compatible with acute pancreatitis. Electronically Signed   By: Franky Crease M.D.   On: 05/05/2024 04:01    Review of Systems  HENT:  Negative for ear discharge, ear pain, hearing loss and tinnitus.   Eyes:  Negative for photophobia and pain.  Respiratory:  Negative for cough and shortness of breath.   Cardiovascular:  Negative for chest pain.  Gastrointestinal:  Positive for abdominal distention, abdominal pain and nausea. Negative for vomiting.  Genitourinary:  Negative for dysuria, flank pain, frequency and urgency.  Musculoskeletal:  Negative for back pain, myalgias and neck pain.  Neurological:  Negative for dizziness and headaches.  Hematological:  Does not bruise/bleed easily.  Psychiatric/Behavioral:  The patient is not nervous/anxious.    Blood pressure 120/67, pulse 100, temperature 97.6 F (36.4 C), temperature source Oral, resp. rate 16, SpO2 98%. Physical Exam Constitutional:  WDWN in NAD, conversant, no obvious deformities; lying in bed comfortably Eyes:  Pupils equal, round; sclera anicteric; moist conjunctiva; no lid lag HENT:  Oral mucosa moist; good dentition  Neck:  No masses palpated, trachea midline; no thyromegaly Lungs:  CTA bilaterally; normal respiratory effort CV:  Regular rate and rhythm; no murmurs; extremities well-perfused with no  edema Abd:  +bowel sounds, soft, mildly tender in epigastrium with some slight radiation towards the right upper quadrant as well as the left upper quadrant.  No palpable masses.  No palpable organomegaly; no palpable hernias Musc:  Unable to assess gait;  no apparent clubbing or cyanosis in extremities Lymphatic:  No palpable cervical or axillary lymphadenopathy Skin:  Warm, dry; no sign of jaundice Psychiatric - alert and oriented x 4; calm mood and affect  Assessment/Plan: Mild biliary pancreatitis with possible biliary colic  Recommend IV hydration, bowel rest with only clear liquids today. As needed pain and nausea medications N.p.o. after midnight Repeat CBC and liver function test tomorrow morning Recommend laparoscopic cholecystectomy with intraoperative cholangiogram to be scheduled for tomorrow morning.The surgical procedure has been discussed with the patient.  Potential risks, benefits, alternative treatments, and expected outcomes have been explained.  All of the patient's questions at this time have been answered.  The likelihood of reaching the patient's treatment goal is good.  The patient understand the proposed surgical procedure and wishes to proceed. I discussed this with the patient and her mother at the bedside.    Judy Hughes Judy Hughes 05/05/2024, 9:58 AM

## 2024-05-05 NOTE — ED Provider Notes (Addendum)
 [place] EMERGENCY DEPARTMENT AT [facility] Provider Note   CSN: [id number] Arrival date & time: 05/05/24  0030     Patient presents with: Abdominal Pain   Judy Hughes is a 18 y.o. female.   Patient presents to the emergency department for evaluation of abdominal pain.  Patient reports diffuse upper abdominal pain, predominantly central epigastric region.  She reports that she was recently hospitalized for same, treated for pancreatitis.  During that hospital stay it was found that her symptoms were secondary to gallbladder sludge.  She has been seen by Dr. Ebbie in the office and there is a plan to remove her gallbladder.       Prior to Admission medications   Medication Sig Start Date End Date Taking? Authorizing Provider  amphetamine -dextroamphetamine  (ADDERALL XR) 10 MG 24 hr capsule Take 1 capsule (10 mg total) by mouth daily. 06/10/23 03/20/24  Belenda Macario HERO, NP  cetirizine  (ZYRTEC ) 10 MG chewable tablet Chew 10 mg by mouth daily.    [provider]  desogestrel -ethinyl estradiol  (ISIBLOOM ) 0.15-30 MG-MCG tablet Take 1 tablet by mouth daily. (continuous) 10/03/23 06/21/24  Klett, Macario HERO, NP  fenofibrate  160 MG tablet Take 1 tablet (160 mg total) by mouth daily. 02/28/24   Singh, Prashant K, MD  Multiple Vitamin (MULTIVITAMIN PO) Take 1 tablet by mouth daily.    [provider]  ondansetron  (ZOFRAN -ODT) 4 MG disintegrating tablet Dissolve 1 tablet (4 mg total) in mouth every 8 (eight) hours as needed for nausea or vomiting. 02/28/24   Dennise Lavada POUR, MD  rosuvastatin  (CRESTOR ) 20 MG tablet Take 1 tablet (20 mg total) by mouth daily. 04/13/24 07/16/24  Belenda Macario HERO, NP  sertraline  (ZOLOFT ) 100 MG tablet Take 1 tablet (100 mg total) by mouth daily. 02/22/24   Belenda Macario HERO, NP    Allergies: Sulfa antibiotics    Review of Systems  Updated Vital Signs BP 131/76   Pulse 99   Temp 98.3 F (36.8 C)   Resp 18   SpO2 98%   Physical Exam Vitals  and nursing note reviewed.  Constitutional:      General: She is not in acute distress.    Appearance: She is well-developed.  HENT:     Head: Normocephalic and atraumatic.     Mouth/Throat:     Mouth: Mucous membranes are moist.  Eyes:     General: Vision grossly intact. Gaze aligned appropriately.     Extraocular Movements: Extraocular movements intact.     Conjunctiva/sclera: Conjunctivae normal.  Cardiovascular:     Rate and Rhythm: Normal rate and regular rhythm.     Pulses: Normal pulses.     Heart sounds: Normal heart sounds, S1 normal and S2 normal. No murmur heard.    No friction rub. No gallop.  Pulmonary:     Effort: Pulmonary effort is normal. No respiratory distress.     Breath sounds: Normal breath sounds.  Abdominal:     General: Bowel sounds are normal.     Palpations: Abdomen is soft.     Tenderness: There is abdominal tenderness in the right upper quadrant and epigastric area. There is no guarding or rebound.     Hernia: No hernia is present.  Musculoskeletal:        General: No swelling.     Cervical back: Full passive range of motion without pain, normal range of motion and neck supple. No spinous process tenderness or muscular tenderness. Normal range of motion.     Right  lower leg: No edema.     Left lower leg: No edema.  Skin:    General: Skin is warm and dry.     Capillary Refill: Capillary refill takes less than 2 seconds.     Findings: No ecchymosis, erythema, rash or wound.  Neurological:     General: No focal deficit present.     Mental Status: She is alert and oriented to person, place, and time.     GCS: GCS eye subscore is 4. GCS verbal subscore is 5. GCS motor subscore is 6.     Cranial Nerves: Cranial nerves 2-12 are intact.     Sensory: Sensation is intact.     Motor: Motor function is intact.     Coordination: Coordination is intact.  Psychiatric:        Attention and Perception: Attention normal.        Mood and Affect: Mood normal.         Speech: Speech normal.        Behavior: Behavior normal.     (all labs ordered are listed, but only abnormal results are displayed) Labs Reviewed  LIPASE, BLOOD - Abnormal; Notable for the following components:      Result Value   Lipase 95 (*)    All other components within normal limits  COMPREHENSIVE METABOLIC PANEL WITH GFR - Abnormal; Notable for the following components:   CO2 15 (*)    Anion gap 18 (*)    All other components within normal limits  CBC - Abnormal; Notable for the following components:   WBC 18.3 (*)    HCT 35.4 (*)    Platelets 405 (*)    All other components within normal limits  URINALYSIS, ROUTINE W REFLEX MICROSCOPIC  PREGNANCY, URINE  POTASSIUM    EKG: None  Radiology: CT ABDOMEN PELVIS W CONTRAST Result Date: 05/05/2024 CLINICAL DATA:  Abdominal pain EXAM: CT ABDOMEN AND PELVIS WITH CONTRAST TECHNIQUE: Multidetector CT imaging of the abdomen and pelvis was performed using the standard protocol following bolus administration of intravenous contrast. RADIATION DOSE REDUCTION: This exam was performed according to the departmental dose-optimization program which includes automated exposure control, adjustment of the mA and/or kV according to patient size and/or use of iterative reconstruction technique. CONTRAST:  OMNIPAQUE  IOHEXOL  300 MG/ML  SOLN COMPARISON:  02/24/2024 FINDINGS: Lower chest: Dependent atelectasis in the left lower lobe. Hepatobiliary: No focal hepatic abnormality. Gallbladder unremarkable. Pancreas: No focal abnormality or ductal dilatation. Inflammation surrounding the pancreatic head and body compatible with acute pancreatitis. Spleen: No focal abnormality.  Normal size. Adrenals/Urinary Tract: No adrenal abnormality. No focal renal abnormality. No stones or hydronephrosis. Urinary bladder is unremarkable. Stomach/Bowel: Normal appendix. Stomach, large and small bowel grossly unremarkable. Vascular/Lymphatic: No evidence of  aneurysm or adenopathy. Reproductive: Uterus and adnexa unremarkable.  No mass. Other: Trace free fluid in the pelvis.  No free air. Musculoskeletal: No acute bony abnormality. IMPRESSION: Edema/inflammation surrounding the pancreatic head and body compatible with acute pancreatitis. Electronically Signed   By: Franky Crease M.D.   On: 05/05/2024 04:01     Procedures   Medications Ordered in the ED  ondansetron  (ZOFRAN ) injection 4 mg (4 mg Intravenous Given 05/05/24 0106)  lactated ringers  bolus 1,000 mL (0 mLs Intravenous Stopped 05/05/24 0300)  HYDROmorphone  (DILAUDID ) injection 1 mg (1 mg Intravenous Given 05/05/24 0214)  metoCLOPramide  (REGLAN ) injection 10 mg (10 mg Intravenous Given 05/05/24 0214)  cefTRIAXone  (ROCEPHIN ) 2 g in sodium chloride  0.9 % 100 mL IVPB (  0 g Intravenous Stopped 05/05/24 0344)  iohexol  (OMNIPAQUE ) 300 MG/ML solution 100 mL (100 mLs Intravenous Contrast Given 05/05/24 0357)                                    Medical Decision Making Amount and/or Complexity of Data Reviewed External Data Reviewed: labs, radiology and notes. Labs: ordered. Decision-making details documented in ED Course. Radiology: ordered and independent interpretation performed. Decision-making details documented in ED Course.  Risk Prescription drug management.   Differential Diagnosis considered includes, but not limited to: Cholelithiasis; cholecystitis; cholangitis; bowel obstruction; esophagitis; gastritis; peptic ulcer disease; pancreatitis; cardiac.  Patient presents with upper abdominal pain.  Records reviewed from her recent hospitalization.  Patient did at that time have mild elevation of lipase up to 136.  Lipase was normalized at time of discharge.  Her MRCP only showed sludge, no pancreatic changes.  She is to be scheduled for cholecystectomy.  With increased pain tonight.  She does have a mild elevation of her lipase into the 90s.  Pain is in the central and right upper abdomen,  radiates into the back.  Suspect that this is biliary colic.  Discussed with Dr. Dasie, on-call for surgery.  She request repeat CT and a medicine admission as she may need treatment for pancreatitis prior to any surgeries.     Final diagnoses:  Upper abdominal pain  Acute pancreatitis, unspecified complication status, unspecified pancreatitis type    ED Discharge Orders     None          Haze Lonni PARAS, MD 05/05/24 [id number]    Haze Lonni PARAS, MD 05/05/24 [phone]

## 2024-05-05 NOTE — H&P (Signed)
 History and Physical  Judy Hughes FMW:[id number] DOB: April 04, 2006 DOA: 05/05/2024  PCP: Belenda Macario HERO, NP   Chief Complaint: Abdominal pain  HPI: Judy Hughes is a 18 y.o. female with medical history significant for HLD, PCOS recent hospitalization for pancreatitis being admitted to the hospital with recurrent mild pancreatitis.  History is provided by the patient as well as her mother who is at the bedside, she was hospitalized at [facility] in June with pancreatitis, at the time etiology was unclear.  She later had outpatient GI follow-up and MRCP which showed evidence of gallbladder sludge, was referred to general surgery to consider cholecystectomy.  They were planning for surgery, however it was not yet scheduled yesterday she had sudden onset of epigastric abdominal pain, and vomiting.  No fever.  Symptoms very reminiscent of when she was admitted to the hospital in June.  She was admitted back to the hospitalist service at [facility], has already been seen by general surgery who plans cholecystectomy in the morning.  Review of Systems: Please see HPI for pertinent positives and negatives. A complete 10 system review of systems are otherwise negative.  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 08/02/2012   Urinary tract infection    x3 as of 6/12   Verruca vulgaris 05/30/2020   Past Surgical History:  Procedure Laterality Date   LACRIMAL TUBE INSERTION     Social History:  reports that she has never smoked. She has been exposed to tobacco smoke. She has never used smokeless tobacco. She reports that she does  not drink alcohol and does not use drugs.  Allergies  Allergen Reactions   Sulfa Antibiotics Rash    Family History  Problem Relation Age of Onset   Other Mother        +anticardiolipin antibody   Migraines Mother    Cholelithiasis Father    Depression Sister    Depression Brother    Bipolar disorder Brother    Other Brother        chronic back pain   Diabetes Maternal Aunt    Diabetes Paternal Uncle    Heart disease Maternal Grandmother        MI, CAD, died at 106, new MI   Cancer Maternal Grandfather 35       colorectal   Diabetes Maternal Grandfather    Celiac disease Neg Hx    Stroke Neg Hx      Prior to Admission medications   Medication Sig Start Date End Date Taking? Authorizing Provider  amphetamine -dextroamphetamine  (ADDERALL XR) 10 MG 24 hr capsule Take 1 capsule (10 mg total) by mouth daily. 06/10/23 05/05/24 Yes Klett, Macario HERO, NP  cetirizine  (ZYRTEC ) 10 MG tablet Take 10 mg by mouth daily.   Yes [provider]  desogestrel -ethinyl estradiol  (ISIBLOOM ) 0.15-30 MG-MCG tablet Take 1 tablet by mouth daily. (continuous) 10/03/23 06/21/24 Yes Klett, Macario HERO, NP  ondansetron  (ZOFRAN -ODT) 4 MG disintegrating tablet Dissolve 1 tablet (4 mg total) in mouth every 8 (eight) hours as needed for nausea or vomiting. 02/28/24  Yes Dennise Lavada POUR, MD  rosuvastatin  (CRESTOR ) 20 MG tablet Take 1 tablet (20  mg total) by mouth daily. 04/13/24 07/16/24 Yes Klett, Macario HERO, NP  sertraline  (ZOLOFT ) 100 MG tablet Take 1 tablet (100 mg total) by mouth daily. 02/22/24  Yes Klett, Macario HERO, NP  fenofibrate  160 MG tablet Take 1 tablet (160 mg total) by mouth daily. Patient not taking: Reported on 05/05/2024 02/28/24   Singh, Prashant K, MD  Multiple Vitamin (MULTIVITAMIN PO) Take 1 tablet by mouth daily. Patient not taking: Reported on 05/05/2024    [provider]    Physical Exam: BP 120/67 (BP Location: Right Arm)   Pulse 100   Temp 97.6 F (36.4 C) (Oral)   Resp 16   SpO2 98%   General:  Alert, oriented, calm, in no acute distress, mother at the bedside Cardiovascular: RRR, no murmurs or rubs, no peripheral edema  Respiratory: clear to auscultation bilaterally, no wheezes, no crackles  Abdomen: soft, epigastric tenderness with some rebound, nondistended, normal bowel tones heard  Skin: dry, no rashes  Musculoskeletal: no joint effusions, normal range of motion  Psychiatric: appropriate affect, normal speech  Neurologic: extraocular muscles intact, clear speech, moving all extremities with intact sensorium         Labs on Admission:  Basic Metabolic Panel: Recent Labs  Lab 05/05/24 0057 05/05/24 0216  NA 135  --   K HEMOLYSIS AT THIS LEVEL MAY AFFECT RESULT 3.8  CL 102  --   CO2 15*  --   GLUCOSE 98  --   BUN 7  --   CREATININE 0.52  --   CALCIUM  9.4  --    Liver Function Tests: Recent Labs  Lab 05/05/24 0057  AST 30  ALT 28  ALKPHOS 79  BILITOT 0.2  PROT 7.5  ALBUMIN 4.0   Recent Labs  Lab 05/05/24 0057  LIPASE 95*   No results for input(s): AMMONIA in the last 168 hours. CBC: Recent Labs  Lab 05/05/24 0057  WBC 18.3*  HGB 12.1  HCT 35.4*  MCV 81.4  PLT 405*   Cardiac Enzymes: No results for input(s): CKTOTAL, CKMB, CKMBINDEX, TROPONINI in the last 168 hours. BNP (last 3 results) No results for input(s): BNP in the last 8760 hours.  ProBNP (last 3 results) No results for input(s): PROBNP in the last 8760 hours.  CBG: No results for input(s): GLUCAP in the last 168 hours.  Radiological Exams on Admission: CT ABDOMEN PELVIS W CONTRAST Result Date: 05/05/2024 CLINICAL DATA:  Abdominal pain EXAM: CT ABDOMEN AND PELVIS WITH CONTRAST TECHNIQUE: Multidetector CT imaging of the abdomen and pelvis was performed using the standard protocol following bolus administration of intravenous contrast. RADIATION DOSE REDUCTION: This exam was performed according to the departmental dose-optimization program which includes  automated exposure control, adjustment of the mA and/or kV according to patient size and/or use of iterative reconstruction technique. CONTRAST:  OMNIPAQUE  IOHEXOL  300 MG/ML  SOLN COMPARISON:  02/24/2024 FINDINGS: Lower chest: Dependent atelectasis in the left lower lobe. Hepatobiliary: No focal hepatic abnormality. Gallbladder unremarkable. Pancreas: No focal abnormality or ductal dilatation. Inflammation surrounding the pancreatic head and body compatible with acute pancreatitis. Spleen: No focal abnormality.  Normal size. Adrenals/Urinary Tract: No adrenal abnormality. No focal renal abnormality. No stones or hydronephrosis. Urinary bladder is unremarkable. Stomach/Bowel: Normal appendix. Stomach, large and small bowel grossly unremarkable. Vascular/Lymphatic: No evidence of aneurysm or adenopathy. Reproductive: Uterus and adnexa unremarkable.  No mass. Other: Trace free fluid in the pelvis.  No free air. Musculoskeletal: No acute bony abnormality. IMPRESSION: Edema/inflammation surrounding the pancreatic  head and body compatible with acute pancreatitis. Electronically Signed   By: Franky Crease M.D.   On: 05/05/2024 04:01   Assessment/Plan Judy Hughes is a 18 y.o. female with medical history significant for HLD, PCOS recent hospitalization for pancreatitis being admitted to the hospital with recurrent mild pancreatitis.  Recurrent acute pancreatitis-likely biliary, very mild.  Patient is stable, without pain or nausea, minimally tender on exam. -Inpatient admission -IV fluids -Pain and nausea medication as needed -Clear liquid diet  Cholelithiasis-without evidence of obvious cholecystitis, however with gallbladder stage likely is the source of her pancreatitis.  Has already been evaluated by general surgery this morning. -N.p.o. after midnight  Hyperlipidemia-recommend continuing statin at discharge  Leukocytosis-likely reactive from her vomiting, patient without fever or any other  objective findings of acute bacterial infection.  Will hold off on further antibiotics for the time being.  DVT prophylaxis: Lovenox      Code Status: Full Code  Consults called: General Surgery  Admission status: The appropriate patient status for this patient is INPATIENT. Inpatient status is judged to be reasonable and necessary in order to provide the required intensity of service to ensure the patient's safety. The patient's presenting symptoms, physical exam findings, and initial radiographic and laboratory data in the context of their chronic comorbidities is felt to place them at high risk for further clinical deterioration. Furthermore, it is not anticipated that the patient will be medically stable for discharge from the hospital within 2 midnights of admission.    I certify that at the point of admission it is my clinical judgment that the patient will require inpatient hospital care spanning beyond 2 midnights from the point of admission due to high intensity of service, high risk for further deterioration and high frequency of surveillance required  Time spent: 55 minutes  Kamylle Axelson CHRISTELLA Gail MD [facility] Pager [phone]  If 7PM-7AM, please contact night-coverage [email or web address] Password [email or web address]  05/05/2024, 10:09 AM

## 2024-05-05 NOTE — ED Triage Notes (Signed)
 Pt reports she is here today due to abd pain. Pt reports she thinks it is pancreatitis flare-up. Pt reports the flare-up started today. Pt reports pain management unsuccessful at home.

## 2024-05-05 NOTE — Progress Notes (Signed)
 Plan of Care Note for accepted transfer   Patient: Judy Hughes MRN: [id number]   DOA: 05/05/2024  Facility requesting transfer: [facility]   Requesting Provider: Dr. Haze   Reason for transfer: Acute pancreatitis   Facility course: 18 yr old female with hx of PCOS, migraine, HLD, and admission last month for pancreatitis who was found to have gallbladder sludge and was planned for cholecystectomy but now returns with recurrent abdominal pain.   Labs notable for CO2 15, AG 18, WBC 18.3, lipase 95, and normal LFTs. CT findings are consistent with acute pancreatitis.   Surgery (Dr. Dasie) was consulted by ED physician and pt was treated with IVF, antiemetics, analgesics, and Rocephin .   Plan of care: The patient is accepted for admission to Med-surg  unit, at [facility].   Author: Evalene GORMAN Sprinkles, MD 05/05/2024  Check [email or web address] for on-call coverage.  Nursing staff, Please call [facility] Admits & Consults System-Wide number on Amion as soon as patient's arrival, so appropriate admitting provider can evaluate the pt.

## 2024-05-05 NOTE — ED Notes (Signed)
-  Called [facility] at 457am for transportation to [id number].

## 2024-05-05 NOTE — ED Notes (Signed)
 Report called to Darryle Darra FREEZE and given to [facility]

## 2024-05-05 NOTE — Plan of Care (Signed)

## 2024-05-05 NOTE — H&P (View-Only) (Signed)
 Reason for Consult:Biliary pancreatitis Referring Physician: Opyd, [facility]  Judy Hughes is an 18 y.o. female.  HPI: This is a 18 year old female with a history of PCOS, migraine headaches, hyperlipidemia who presents after a recent hospitalization for pancreatitis.  Imaging showed no sign of bile duct dilatation or gallstones.  On 03/28/2024, she underwent an MRCP that showed some gallbladder sludge with no sign of choledocholithiasis or obstruction.  She was having minimal symptoms.  She was seen on 04/24/2024 by Dr. Ebbie who planned an elective cholecystectomy.  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  She presented to the emergency department earlier this morning with diffuse upper abdominal pain.  She was noted to have a mild elevation of her lipase into the 90s.  Her pain is located mostly in her epigastrium with radiation towards the left upper quadrant as well as the right upper quadrant.  CT scan showed some inflammation surround the pancreatic head and body consistent with acute pancreatitis.  No radiologic signs of acute cholecystitis.  She was transferred to [facility] for further treatment.  She is being admitted by the hospitalist.  We are consulted to discuss possible cholecystectomy.  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 08/02/2012   Urinary tract infection    x3 as of 6/12   Verruca vulgaris 05/30/2020    Past Surgical History:  Procedure Laterality Date   LACRIMAL TUBE INSERTION      Family History  Problem Relation Age of Onset   Other Mother         +anticardiolipin antibody   Migraines Mother    Cholelithiasis Father    Depression Sister    Depression Brother    Bipolar disorder Brother    Other Brother        chronic back pain   Diabetes Maternal Aunt    Diabetes Paternal Uncle    Heart disease Maternal Grandmother        MI, CAD, died at 63, new MI   Cancer Maternal Grandfather 45       colorectal   Diabetes Maternal Grandfather    Celiac disease Neg Hx    Stroke Neg Hx     Social History:  reports that she has never smoked. She has been exposed to tobacco smoke. She has never used smokeless tobacco. She reports that she does not drink alcohol and does not use drugs.  Allergies:  Allergies  Allergen Reactions   Sulfa Antibiotics Rash    Medications:  Prior to Admission medications   Medication Sig Start Date End Date Taking? Authorizing Provider  amphetamine -dextroamphetamine  (ADDERALL XR) 10 MG 24 hr capsule Take 1 capsule (10 mg total) by mouth daily. 06/10/23 05/05/24 Yes Klett, Macario HERO, NP  cetirizine  (ZYRTEC ) 10 MG tablet Take 10 mg by mouth daily.   Yes [provider]  desogestrel -ethinyl estradiol  (ISIBLOOM ) 0.15-30 MG-MCG tablet Take 1 tablet by mouth daily. (continuous) 10/03/23 06/21/24 Yes Klett, Macario HERO, NP  ondansetron  (ZOFRAN -ODT) 4 MG disintegrating tablet Dissolve 1 tablet (4 mg total) in mouth every 8 (eight) hours as needed for  nausea or vomiting. 02/28/24  Yes Dennise Lavada POUR, MD  rosuvastatin  (CRESTOR ) 20 MG tablet Take 1 tablet (20 mg total) by mouth daily. 04/13/24 07/16/24 Yes Klett, Macario HERO, NP  sertraline  (ZOLOFT ) 100 MG tablet Take 1 tablet (100 mg total) by mouth daily. 02/22/24  Yes Klett, Macario HERO, NP  fenofibrate  160 MG tablet Take 1 tablet (160 mg total) by mouth daily. Patient not taking: Reported on 05/05/2024 02/28/24   Singh, Prashant K, MD  Multiple Vitamin (MULTIVITAMIN PO) Take 1 tablet by mouth daily. Patient not taking: Reported on 05/05/2024    [provider]      Results for orders placed or performed during the hospital encounter of 05/05/24 (from the past 48 hours)  Urinalysis, Routine w reflex microscopic -Urine, Clean Catch     Status: None   Collection Time: 05/05/24 12:52 AM  Result Value Ref Range   Color, Urine YELLOW YELLOW   APPearance CLEAR CLEAR   Specific Gravity, Urine 1.024 1.005 - 1.030   pH 6.0 5.0 - 8.0   Glucose, UA NEGATIVE NEGATIVE mg/dL   Hgb urine dipstick NEGATIVE NEGATIVE   Bilirubin Urine NEGATIVE NEGATIVE   Ketones, ur NEGATIVE NEGATIVE mg/dL   Protein, ur NEGATIVE NEGATIVE mg/dL   Nitrite NEGATIVE NEGATIVE   Leukocytes,Ua NEGATIVE NEGATIVE    Comment: Performed at [facility], [address] [place], [place] [address]  Pregnancy, urine     Status: None   Collection Time: 05/05/24 12:52 AM  Result Value Ref Range   Preg Test, Ur NEGATIVE NEGATIVE    Comment:        THE SENSITIVITY OF THIS METHODOLOGY IS >20 mIU/mL. Performed at [facility], [address], [place], [place] [address]   Lipase, blood     Status: Abnormal   Collection Time: 05/05/24 12:57 AM  Result Value Ref Range   Lipase 95 (H) 11 - 51 U/L    Comment: Performed at [facility], [address], [place], [place] [address]  Comprehensive metabolic panel     Status: Abnormal   Collection Time: 05/05/24 12:57 AM  Result Value Ref Range   Sodium 135 135 - 145 mmol/L   Potassium HEMOLYSIS AT THIS LEVEL MAY AFFECT RESULT 3.5 - 5.1 mmol/L    Comment: Signe Rubenstein, RN [phone] 05/05/24 EO   Chloride 102 98 - 111 mmol/L   CO2 15 (L) 22 - 32 mmol/L   Glucose, Bld 98 70 - 99 mg/dL    Comment: Glucose reference range applies only to samples taken after fasting for at least 8 hours.   BUN 7 6 - 20 mg/dL   Creatinine, Ser 9.47 0.44 - 1.00 mg/dL   Calcium  9.4 8.9 - 10.3 mg/dL   Total Protein 7.5 6.5 - 8.1 g/dL   Albumin 4.0 3.5 - 5.0 g/dL   AST 30 15 - 41 U/L    Comment: HEMOLYSIS AT THIS  LEVEL MAY AFFECT RESULT   ALT 28 0 - 44 U/L   Alkaline Phosphatase 79 38 - 126 U/L   Total Bilirubin 0.2 0.0 - 1.2 mg/dL   GFR, Estimated >39 >39 mL/min    Comment: (NOTE) Calculated using the CKD-EPI Creatinine Equation (2021)    Anion gap 18 (H) 5 - 15    Comment: Performed at [facility], [address] [place], [place] [address]  CBC     Status: Abnormal   Collection Time: 05/05/24 12:57 AM  Result Value Ref Range  WBC 18.3 (H) 4.0 - 10.5 K/uL   RBC 4.35 3.87 - 5.11 MIL/uL   Hemoglobin 12.1 12.0 - 15.0 g/dL   HCT 64.5 (L) 63.9 - 53.9 %   MCV 81.4 80.0 - 100.0 fL   MCH 27.8 26.0 - 34.0 pg   MCHC 34.2 30.0 - 36.0 g/dL   RDW 87.5 88.4 - 84.4 %   Platelets 405 (H) 150 - 400 K/uL   nRBC 0.0 0.0 - 0.2 %    Comment: Performed at [facility], [address], [place], [place] [address]  Potassium     Status: None   Collection Time: 05/05/24  2:16 AM  Result Value Ref Range   Potassium 3.8 3.5 - 5.1 mmol/L    Comment: HEMOLYSIS AT THIS LEVEL MAY AFFECT RESULT Performed at [facility], [address], [place], [place] [address]     CT ABDOMEN PELVIS W CONTRAST Result Date: 05/05/2024 CLINICAL DATA:  Abdominal pain EXAM: CT ABDOMEN AND PELVIS WITH CONTRAST TECHNIQUE: Multidetector CT imaging of the abdomen and pelvis was performed using the standard protocol following bolus administration of intravenous contrast. RADIATION DOSE REDUCTION: This exam was performed according to the departmental dose-optimization program which includes automated exposure control, adjustment of the mA and/or kV according to patient size and/or use of iterative reconstruction technique. CONTRAST:  OMNIPAQUE  IOHEXOL  300 MG/ML  SOLN COMPARISON:  02/24/2024 FINDINGS: Lower chest: Dependent atelectasis in the left lower lobe. Hepatobiliary: No focal hepatic abnormality. Gallbladder unremarkable. Pancreas: No focal abnormality or ductal dilatation.  Inflammation surrounding the pancreatic head and body compatible with acute pancreatitis. Spleen: No focal abnormality.  Normal size. Adrenals/Urinary Tract: No adrenal abnormality. No focal renal abnormality. No stones or hydronephrosis. Urinary bladder is unremarkable. Stomach/Bowel: Normal appendix. Stomach, large and small bowel grossly unremarkable. Vascular/Lymphatic: No evidence of aneurysm or adenopathy. Reproductive: Uterus and adnexa unremarkable.  No mass. Other: Trace free fluid in the pelvis.  No free air. Musculoskeletal: No acute bony abnormality. IMPRESSION: Edema/inflammation surrounding the pancreatic head and body compatible with acute pancreatitis. Electronically Signed   By: Franky Crease M.D.   On: 05/05/2024 04:01    Review of Systems  HENT:  Negative for ear discharge, ear pain, hearing loss and tinnitus.   Eyes:  Negative for photophobia and pain.  Respiratory:  Negative for cough and shortness of breath.   Cardiovascular:  Negative for chest pain.  Gastrointestinal:  Positive for abdominal distention, abdominal pain and nausea. Negative for vomiting.  Genitourinary:  Negative for dysuria, flank pain, frequency and urgency.  Musculoskeletal:  Negative for back pain, myalgias and neck pain.  Neurological:  Negative for dizziness and headaches.  Hematological:  Does not bruise/bleed easily.  Psychiatric/Behavioral:  The patient is not nervous/anxious.    Blood pressure 120/67, pulse 100, temperature 97.6 F (36.4 C), temperature source Oral, resp. rate 16, SpO2 98%. Physical Exam Constitutional:  WDWN in NAD, conversant, no obvious deformities; lying in bed comfortably Eyes:  Pupils equal, round; sclera anicteric; moist conjunctiva; no lid lag HENT:  Oral mucosa moist; good dentition  Neck:  No masses palpated, trachea midline; no thyromegaly Lungs:  CTA bilaterally; normal respiratory effort CV:  Regular rate and rhythm; no murmurs; extremities well-perfused with no  edema Abd:  +bowel sounds, soft, mildly tender in epigastrium with some slight radiation towards the right upper quadrant as well as the left upper quadrant.  No palpable masses.  No palpable organomegaly; no palpable hernias Musc:  Unable to assess gait;  no apparent clubbing or cyanosis in extremities Lymphatic:  No palpable cervical or axillary lymphadenopathy Skin:  Warm, dry; no sign of jaundice Psychiatric - alert and oriented x 4; calm mood and affect  Assessment/Plan: Mild biliary pancreatitis with possible biliary colic  Recommend IV hydration, bowel rest with only clear liquids today. As needed pain and nausea medications N.p.o. after midnight Repeat CBC and liver function test tomorrow morning Recommend laparoscopic cholecystectomy with intraoperative cholangiogram to be scheduled for tomorrow morning.The surgical procedure has been discussed with the patient.  Potential risks, benefits, alternative treatments, and expected outcomes have been explained.  All of the patient's questions at this time have been answered.  The likelihood of reaching the patient's treatment goal is good.  The patient understand the proposed surgical procedure and wishes to proceed. I discussed this with the patient and her mother at the bedside.    Donnice POUR Amadi Frady 05/05/2024, 9:58 AM

## 2024-05-06 ENCOUNTER — Inpatient Hospital Stay (HOSPITAL_COMMUNITY): Admitting: Certified Registered Nurse Anesthetist

## 2024-05-06 ENCOUNTER — Other Ambulatory Visit: Payer: Self-pay

## 2024-05-06 ENCOUNTER — Inpatient Hospital Stay (HOSPITAL_COMMUNITY)

## 2024-05-06 ENCOUNTER — Encounter (HOSPITAL_COMMUNITY)
Admission: EM | Disposition: A | Discharge status: Home / Self Care | Payer: Self-pay | Source: Home / Self Care | Attending: Surgery

## 2024-05-06 DIAGNOSIS — K851 Biliary acute pancreatitis without necrosis or infection: Secondary | ICD-10-CM | POA: Diagnosis not present

## 2024-05-06 DIAGNOSIS — F4323 Adjustment disorder with mixed anxiety and depressed mood: Secondary | ICD-10-CM

## 2024-05-06 DIAGNOSIS — K811 Chronic cholecystitis: Secondary | ICD-10-CM

## 2024-05-06 DIAGNOSIS — E782 Mixed hyperlipidemia: Secondary | ICD-10-CM

## 2024-05-06 DIAGNOSIS — F419 Anxiety disorder, unspecified: Secondary | ICD-10-CM

## 2024-05-06 HISTORY — PX: CHOLECYSTECTOMY: SHX55

## 2024-05-06 HISTORY — DX: Chronic cholecystitis: K81.1

## 2024-05-06 LAB — CBC
HCT: 33.2 % — ABNORMAL LOW (ref 36.0–46.0)
Hemoglobin: 10.6 g/dL — ABNORMAL LOW (ref 12.0–15.0)
MCH: 28.1 pg (ref 26.0–34.0)
MCHC: 31.9 g/dL (ref 30.0–36.0)
MCV: 88.1 fL (ref 80.0–100.0)
Platelets: 291 K/uL (ref 150–400)
RBC: 3.77 MIL/uL — ABNORMAL LOW (ref 3.87–5.11)
RDW: 13.4 % (ref 11.5–15.5)
WBC: 11.7 K/uL — ABNORMAL HIGH (ref 4.0–10.5)
nRBC: 0 % (ref 0.0–0.2)

## 2024-05-06 LAB — COMPREHENSIVE METABOLIC PANEL WITH GFR
ALT: 25 U/L (ref 0–44)
AST: 21 U/L (ref 15–41)
Albumin: 2.9 g/dL — ABNORMAL LOW (ref 3.5–5.0)
Alkaline Phosphatase: 54 U/L (ref 38–126)
Anion gap: 9 (ref 5–15)
BUN: <5 mg/dL — ABNORMAL LOW (ref 6–20)
CO2: 21 mmol/L — ABNORMAL LOW (ref 22–32)
Calcium: 8.3 mg/dL — ABNORMAL LOW (ref 8.9–10.3)
Chloride: 103 mmol/L (ref 98–111)
Creatinine, Ser: 0.49 mg/dL (ref 0.44–1.00)
GFR, Estimated: >60 mL/min (ref >60)
Glucose, Bld: 83 mg/dL (ref 70–99)
Potassium: 3.6 mmol/L (ref 3.5–5.1)
Sodium: 133 mmol/L — ABNORMAL LOW (ref 135–145)
Total Bilirubin: 0.5 mg/dL (ref 0.0–1.2)
Total Protein: 6.6 g/dL (ref 6.5–8.1)

## 2024-05-06 SURGERY — LAPAROSCOPIC CHOLECYSTECTOMY WITH INTRAOPERATIVE CHOLANGIOGRAM
Anesthesia: General | Site: Abdomen

## 2024-05-06 MED ORDER — KETOROLAC TROMETHAMINE 30 MG/ML IJ SOLN
30.0000 mg | Freq: Four times a day (QID) | INTRAMUSCULAR | Status: DC
  Administered 2024-05-06 – 2024-05-07 (×4): 30 mg via INTRAVENOUS
  Filled 2024-05-06 (×4): qty 1

## 2024-05-06 MED ORDER — ACETAMINOPHEN 10 MG/ML IV SOLN
INTRAVENOUS | Status: DC | PRN
  Administered 2024-05-06: 1000 mg via INTRAVENOUS

## 2024-05-06 MED ORDER — FENTANYL CITRATE PF 50 MCG/ML IJ SOSY
25.0000 ug | PREFILLED_SYRINGE | INTRAMUSCULAR | Status: DC | PRN
  Administered 2024-05-06: 50 ug via INTRAVENOUS

## 2024-05-06 MED ORDER — PROPOFOL 10 MG/ML IV BOLUS
INTRAVENOUS | Status: DC | PRN
  Administered 2024-05-06: 170 mg via INTRAVENOUS

## 2024-05-06 MED ORDER — FENTANYL CITRATE PF 50 MCG/ML IJ SOSY
PREFILLED_SYRINGE | INTRAMUSCULAR | Status: AC
Start: 2024-05-06 — End: 2024-05-06
  Filled 2024-05-06: qty 1

## 2024-05-06 MED ORDER — DEXAMETHASONE SODIUM PHOSPHATE 4 MG/ML IJ SOLN
INTRAMUSCULAR | Status: DC | PRN
  Administered 2024-05-06: 5 mg via INTRAVENOUS

## 2024-05-06 MED ORDER — DEXAMETHASONE SODIUM PHOSPHATE 10 MG/ML IJ SOLN
INTRAMUSCULAR | Status: AC
  Filled 2024-05-06: qty 1

## 2024-05-06 MED ORDER — ACETAMINOPHEN 10 MG/ML IV SOLN
INTRAVENOUS | Status: AC
  Filled 2024-05-06: qty 100

## 2024-05-06 MED ORDER — ONDANSETRON HCL 4 MG/2ML IJ SOLN
INTRAMUSCULAR | Status: AC
  Filled 2024-05-06: qty 2

## 2024-05-06 MED ORDER — MIDAZOLAM HCL 2 MG/2ML IJ SOLN
INTRAMUSCULAR | Status: AC
Start: 2024-05-06 — End: 2024-05-06
  Filled 2024-05-06: qty 2

## 2024-05-06 MED ORDER — PROPOFOL 1000 MG/100ML IV EMUL
INTRAVENOUS | Status: AC
  Filled 2024-05-06: qty 100

## 2024-05-06 MED ORDER — LIDOCAINE HCL (PF) 2 % IJ SOLN
INTRAMUSCULAR | Status: DC | PRN
  Administered 2024-05-06: 100 mg via INTRADERMAL

## 2024-05-06 MED ORDER — SODIUM CHLORIDE 0.9 % IV SOLN: INTRAVENOUS | Status: DC | PRN

## 2024-05-06 MED ORDER — ONDANSETRON HCL 4 MG/2ML IJ SOLN
INTRAMUSCULAR | Status: DC | PRN
  Administered 2024-05-06: 4 mg via INTRAVENOUS

## 2024-05-06 MED ORDER — HYDROMORPHONE HCL 1 MG/ML IJ SOLN
INTRAMUSCULAR | Status: DC | PRN
  Administered 2024-05-06 (×2): 1 mg via INTRAVENOUS

## 2024-05-06 MED ORDER — KETOROLAC TROMETHAMINE 30 MG/ML IJ SOLN
INTRAMUSCULAR | Status: DC | PRN
  Administered 2024-05-06: 30 mg via INTRAVENOUS

## 2024-05-06 MED ORDER — LACTATED RINGERS IV SOLN
INTRAVENOUS | Status: AC | PRN
  Administered 2024-05-06: 1000 mL

## 2024-05-06 MED ORDER — FENTANYL CITRATE (PF) 100 MCG/2ML IJ SOLN
INTRAMUSCULAR | Status: AC
  Filled 2024-05-06: qty 2

## 2024-05-06 MED ORDER — LACTATED RINGERS IV SOLN: INTRAVENOUS | Status: DC | PRN

## 2024-05-06 MED ORDER — ONDANSETRON HCL 4 MG/2ML IJ SOLN: 4.0000 mg | Freq: Once | INTRAMUSCULAR | Status: DC | PRN

## 2024-05-06 MED ORDER — OXYCODONE HCL 5 MG PO TABS: 5.0000 mg | ORAL_TABLET | Freq: Once | ORAL | Status: DC | PRN

## 2024-05-06 MED ORDER — KETOROLAC TROMETHAMINE 30 MG/ML IJ SOLN
INTRAMUSCULAR | Status: AC
  Filled 2024-05-06: qty 1

## 2024-05-06 MED ORDER — DROPERIDOL 2.5 MG/ML IJ SOLN
INTRAMUSCULAR | Status: DC | PRN
Start: 2024-05-06 — End: 2024-05-06
  Administered 2024-05-06: .625 mg via INTRAVENOUS

## 2024-05-06 MED ORDER — ROCURONIUM BROMIDE 10 MG/ML (PF) SYRINGE
PREFILLED_SYRINGE | INTRAVENOUS | Status: DC | PRN
  Administered 2024-05-06: 50 mg via INTRAVENOUS
  Administered 2024-05-06: 10 mg via INTRAVENOUS

## 2024-05-06 MED ORDER — ONDANSETRON HCL 4 MG/2ML IJ SOLN
4.0000 mg | INTRAMUSCULAR | Status: DC | PRN
  Administered 2024-05-06: 4 mg via INTRAVENOUS
  Filled 2024-05-06: qty 2

## 2024-05-06 MED ORDER — SERTRALINE HCL 100 MG PO TABS
100.0000 mg | ORAL_TABLET | Freq: Every day | ORAL | Status: DC
  Administered 2024-05-06 – 2024-05-07 (×2): 100 mg via ORAL
  Filled 2024-05-06 (×2): qty 1

## 2024-05-06 MED ORDER — LIDOCAINE HCL (PF) 2 % IJ SOLN
INTRAMUSCULAR | Status: AC
  Filled 2024-05-06: qty 5

## 2024-05-06 MED ORDER — HEMOSTATIC AGENTS (NO CHARGE) OPTIME
TOPICAL | Status: DC | PRN
  Administered 2024-05-06: 1 via TOPICAL

## 2024-05-06 MED ORDER — DESOGESTREL-ETHINYL ESTRADIOL 0.15-30 MG-MCG PO TABS
1.0000 | ORAL_TABLET | Freq: Every day | ORAL | Status: DC
  Administered 2024-05-06: 1 via ORAL

## 2024-05-06 MED ORDER — DROPERIDOL 2.5 MG/ML IJ SOLN
INTRAMUSCULAR | Status: AC
  Filled 2024-05-06: qty 2

## 2024-05-06 MED ORDER — SUGAMMADEX SODIUM 200 MG/2ML IV SOLN
INTRAVENOUS | Status: DC | PRN
Start: 2024-05-06 — End: 2024-05-06
  Administered 2024-05-06: 200 mg via INTRAVENOUS

## 2024-05-06 MED ORDER — PROPOFOL 500 MG/50ML IV EMUL
INTRAVENOUS | Status: AC
  Filled 2024-05-06: qty 50

## 2024-05-06 MED ORDER — ACETAMINOPHEN 10 MG/ML IV SOLN
1000.0000 mg | Freq: Once | INTRAVENOUS | Status: DC | PRN
Start: 2024-05-06 — End: 2024-05-06

## 2024-05-06 MED ORDER — AMPHETAMINE-DEXTROAMPHET ER 10 MG PO CP24: 10.0000 mg | ORAL_CAPSULE | Freq: Every day | ORAL | Status: DC

## 2024-05-06 MED ORDER — HYDROMORPHONE HCL 1 MG/ML IJ SOLN: 1.0000 mg | INTRAMUSCULAR | Status: DC | PRN

## 2024-05-06 MED ORDER — PROPOFOL 500 MG/50ML IV EMUL
INTRAVENOUS | Status: DC | PRN
  Administered 2024-05-06: 125 ug/kg/min via INTRAVENOUS

## 2024-05-06 MED ORDER — FENTANYL CITRATE (PF) 100 MCG/2ML IJ SOLN
INTRAMUSCULAR | Status: DC | PRN
  Administered 2024-05-06: 100 ug via INTRAVENOUS
  Administered 2024-05-06: 50 ug via INTRAVENOUS
  Administered 2024-05-06: 100 ug via INTRAVENOUS
  Administered 2024-05-06: 50 ug via INTRAVENOUS

## 2024-05-06 MED ORDER — SUCCINYLCHOLINE CHLORIDE 200 MG/10ML IV SOSY
PREFILLED_SYRINGE | INTRAVENOUS | Status: AC
Start: 2024-05-06 — End: 2024-05-06
  Filled 2024-05-06: qty 10

## 2024-05-06 MED ORDER — MIDAZOLAM HCL 5 MG/5ML IJ SOLN
INTRAMUSCULAR | Status: DC | PRN
  Administered 2024-05-06: 2 mg via INTRAVENOUS

## 2024-05-06 MED ORDER — BUPIVACAINE-EPINEPHRINE (PF) 0.25% -1:200000 IJ SOLN
INTRAMUSCULAR | Status: AC
  Filled 2024-05-06: qty 30

## 2024-05-06 MED ORDER — SUGAMMADEX SODIUM 200 MG/2ML IV SOLN
INTRAVENOUS | Status: AC
  Filled 2024-05-06: qty 2

## 2024-05-06 MED ORDER — HYDROMORPHONE HCL 2 MG/ML IJ SOLN
INTRAMUSCULAR | Status: AC
  Filled 2024-05-06: qty 1

## 2024-05-06 MED ORDER — SODIUM CHLORIDE 0.9 % IV SOLN: INTRAVENOUS | Status: DC

## 2024-05-06 MED ORDER — SUCCINYLCHOLINE CHLORIDE 200 MG/10ML IV SOSY
PREFILLED_SYRINGE | INTRAVENOUS | Status: DC | PRN
Start: 2024-05-06 — End: 2024-05-06
  Administered 2024-05-06: 120 mg via INTRAVENOUS

## 2024-05-06 MED ORDER — BUPIVACAINE-EPINEPHRINE 0.25% -1:200000 IJ SOLN
INTRAMUSCULAR | Status: DC | PRN
  Administered 2024-05-06: 20 mL

## 2024-05-06 MED ORDER — 0.9 % SODIUM CHLORIDE (POUR BTL) OPTIME
TOPICAL | Status: DC | PRN
  Administered 2024-05-06: 1000 mL

## 2024-05-06 MED ORDER — ROCURONIUM BROMIDE 10 MG/ML (PF) SYRINGE
PREFILLED_SYRINGE | INTRAVENOUS | Status: AC
  Filled 2024-05-06: qty 10

## 2024-05-06 MED ORDER — OXYCODONE HCL 5 MG PO TABS
5.0000 mg | ORAL_TABLET | ORAL | Status: DC | PRN
  Administered 2024-05-06 – 2024-05-07 (×3): 10 mg via ORAL
  Filled 2024-05-06 (×3): qty 2

## 2024-05-06 MED ORDER — OXYCODONE HCL 5 MG PO TABS
5.0000 mg | ORAL_TABLET | ORAL | Status: DC | PRN
  Administered 2024-05-06: 5 mg via ORAL
  Filled 2024-05-06: qty 1

## 2024-05-06 MED ORDER — FENTANYL CITRATE (PF) 100 MCG/2ML IJ SOLN
INTRAMUSCULAR | Status: AC
Start: 2024-05-06 — End: 2024-05-06
  Filled 2024-05-06: qty 2

## 2024-05-06 MED ORDER — OXYCODONE HCL 5 MG/5ML PO SOLN: 5.0000 mg | Freq: Once | ORAL | Status: DC | PRN

## 2024-05-06 MED ORDER — OXYCODONE HCL 5 MG PO TABS: 5.0000 mg | ORAL_TABLET | Freq: Four times a day (QID) | ORAL | 0 refills | Status: DC | PRN

## 2024-05-06 MED ORDER — SCOPOLAMINE 1 MG/3DAYS TD PT72
MEDICATED_PATCH | TRANSDERMAL | Status: AC
  Filled 2024-05-06: qty 1

## 2024-05-06 MED ORDER — LORATADINE 10 MG PO TABS
10.0000 mg | ORAL_TABLET | Freq: Every day | ORAL | Status: DC
  Administered 2024-05-06 – 2024-05-07 (×2): 10 mg via ORAL
  Filled 2024-05-06 (×2): qty 1

## 2024-05-06 SURGICAL SUPPLY — 33 items
BENZOIN TINCTURE PRP APPL 2/3 (GAUZE/BANDAGES/DRESSINGS) ×1 IMPLANT
CABLE HIGH FREQUENCY MONO STRZ (ELECTRODE) ×1 IMPLANT
CHLORAPREP W/TINT 26 (MISCELLANEOUS) ×1 IMPLANT
CLIP APPLIE ROT 10 11.4 M/L (STAPLE) ×1 IMPLANT
COVER MAYO STAND XLG (MISCELLANEOUS) ×1 IMPLANT
COVER SURGICAL LIGHT HANDLE (MISCELLANEOUS) ×1 IMPLANT
DRAPE C-ARM 42X120 X-RAY (DRAPES) ×1 IMPLANT
DRSG TEGADERM 2-3/8X2-3/4 SM (GAUZE/BANDAGES/DRESSINGS) ×3 IMPLANT
DRSG TEGADERM 4X4.75 (GAUZE/BANDAGES/DRESSINGS) ×1 IMPLANT
ELECT REM PT RETURN 15FT ADLT (MISCELLANEOUS) ×1 IMPLANT
GAUZE SPONGE 2X2 8PLY STRL LF (GAUZE/BANDAGES/DRESSINGS) IMPLANT
GLOVE BIO SURGEON STRL SZ7 (GLOVE) ×1 IMPLANT
GLOVE BIOGEL PI IND STRL 7.5 (GLOVE) ×1 IMPLANT
GOWN STRL REUS W/ TWL LRG LVL3 (GOWN DISPOSABLE) ×1 IMPLANT
HEMOSTAT SNOW SURGICEL 2X4 (HEMOSTASIS) IMPLANT
IRRIGATION SUCT STRKRFLW 2 WTP (MISCELLANEOUS) ×1 IMPLANT
KIT BASIN OR (CUSTOM PROCEDURE TRAY) ×1 IMPLANT
KIT TURNOVER KIT A (KITS) ×1 IMPLANT
NS IRRIG 1000ML POUR BTL (IV SOLUTION) ×1 IMPLANT
POUCH RETRIEVAL ECOSAC 10 (ENDOMECHANICALS) IMPLANT
SCISSORS LAP 5X35 DISP (ENDOMECHANICALS) ×1 IMPLANT
SET CHOLANGIOGRAPH MIX (MISCELLANEOUS) ×1 IMPLANT
SET TUBE SMOKE EVAC HIGH FLOW (TUBING) ×1 IMPLANT
SLEEVE Z-THREAD 5X100MM (TROCAR) ×1 IMPLANT
SPIKE FLUID TRANSFER (MISCELLANEOUS) ×1 IMPLANT
STRIP CLOSURE SKIN 1/2X4 (GAUZE/BANDAGES/DRESSINGS) ×1 IMPLANT
SUT MNCRL AB 4-0 PS2 18 (SUTURE) ×1 IMPLANT
SYSTEM BAG RETRIEVAL 10MM (BASKET) IMPLANT
TOWEL OR 17X26 10 PK STRL BLUE (TOWEL DISPOSABLE) ×1 IMPLANT
TRAY LAPAROSCOPIC (CUSTOM PROCEDURE TRAY) ×1 IMPLANT
TROCAR 11X100 Z THREAD (TROCAR) ×1 IMPLANT
TROCAR BALLN 12MMX100 BLUNT (TROCAR) ×1 IMPLANT
TROCAR Z-THREAD OPTICAL 5X100M (TROCAR) ×1 IMPLANT

## 2024-05-06 NOTE — Progress Notes (Signed)
 PROGRESS NOTE    Judy Hughes  FMW:[id number] DOB: [birth year] DOA: 05/05/2024 PCP: Belenda Macario HERO, NP    Chief Complaint  Patient presents with   Abdominal Pain    Brief Narrative:  Judy Hughes is a 18 y.o. female with medical history significant for HLD, PCOS recent hospitalization for pancreatitis being admitted to the hospital with recurrent mild biliary pancreatitis.  Patient seen in consultation by general surgery and patient underwent laparoscopic cholecystectomy with IOC, 05/06/2024.   Assessment & Plan:   Principal Problem:   Acute pancreatitis Active Problems:   Chronic cholecystitis   Mixed hyperlipidemia   Anxiety disorder, unspecified   Attention-deficit hyperactivity disorder, unspecified type   Adjustment disorder with mixed anxiety and depressed mood  #1 recurrent acute biliary pancreatitis/chronic cholecystitis -Patient noted to have presented with sudden onset epigastric abdominal pain, vomiting. - Patient noted to have elevated lipase. - CT abdomen and pelvis with edema/inflammation surrounding the pancreatic head and body compatible with acute pancreatitis - Patient placed on bowel rest, IV fluids, IV antiemetics, pain management, supportive care. - Patient seen in consultation by general surgery and patient underwent laparoscopic cholecystectomy with IOC this morning, 05/06/2024 without any complications. -Diet advancement, pain management per general surgery.  2.  Hyperlipidemia -Statin on hold. -Resume statin on discharge.  3.  Leukocytosis -Likely reactive leukocytosis secondary to problem #1. -Leukocytosis trending down. -Patient with no urinary symptoms or respiratory symptoms. -Patient afebrile. -Continue to hold off on antibiotics.  4.  Anxiety/depression -Resume home regimen Zoloft .   -General surgery has graciously agreed to take over care and be primary on this patient. -Nothing further to add from a medicine standpoint and we will sign  off. - Please call with questions if needed.   DVT prophylaxis: SCDs Code Status: Full Family Communication: Updated patient.  No family at bedside. Disposition: Home when cleared by general surgery  Status is: Inpatient Remains inpatient appropriate because: Severity of illness   Consultants:  General Surgery: Dr.Tsuei 05/05/2024  Procedures:  CT abdomen and pelvis 05/05/2024 Laparoscopic cholecystectomy with IOC per general surgery: Dr.Tsuei 05/06/2024  Antimicrobials:  Anti-infectives (From admission, onward)    Start     Dose/Rate Route Frequency Ordered Stop   05/06/24 0300  cefTRIAXone  (ROCEPHIN ) 2 g in sodium chloride  0.9 % 100 mL IVPB        2 g 200 mL/hr over 30 Minutes Intravenous Every 24 hours 05/05/24 0939     05/05/24 0300  cefTRIAXone  (ROCEPHIN ) 2 g in sodium chloride  0.9 % 100 mL IVPB        2 g 200 mL/hr over 30 Minutes Intravenous  Once 05/05/24 0249 05/05/24 0344         Subjective: Patient in PACU.  Just came out of the OR.  Denies any chest pain.  Feels shortness of breath has improved.  Feels abdominal pain also has improved.  Objective: Vitals:   05/06/24 0930 05/06/24 0945 05/06/24 0952 05/06/24 1001  BP: 117/71 118/69  125/76  Pulse: (!) 113 (!) 109 (!) 112 (!) 104  Resp: 19 13 19 19   Temp:      TempSrc:      SpO2: 96% 93% 96% 96%    Intake/Output Summary (Last 24 hours) at 05/06/2024 1014 Last data filed at 05/06/2024 0902 Gross per 24 hour  Intake 3339.22 ml  Output 25 ml  Net 3314.22 ml   There were no vitals filed for this visit.  Examination:  General exam: Appears calm and comfortable  Respiratory  system: Clear to auscultation anterior lung fields.  No wheezes, no crackles, no rhonchi.  Fair air movement.  Speaking in full sentences.SABRA Respiratory effort normal. Cardiovascular system: S1 & S2 heard, RRR. No JVD, murmurs, rubs, gallops or clicks. No pedal edema. Gastrointestinal system: Abdomen is nondistended, soft and some  minimal diffuse tenderness to palpation.  Incision sites C/D/I.  Positive bowel sounds.  No rebound.  No guarding.  Central nervous system: Alert and oriented. No focal neurological deficits. Extremities: Symmetric 5 x 5 power. Skin: No rashes, lesions or ulcers Psychiatry: Judgement and insight appear normal. Mood & affect appropriate.     Data Reviewed: I have personally reviewed following labs and imaging studies  CBC: Recent Labs  Lab 05/05/24 0057 05/06/24 0436  WBC 18.3* 11.7*  HGB 12.1 10.6*  HCT 35.4* 33.2*  MCV 81.4 88.1  PLT 405* 291    Basic Metabolic Panel: Recent Labs  Lab 05/05/24 0057 05/05/24 0216 05/06/24 0436  NA 135  --  133*  K HEMOLYSIS AT THIS LEVEL MAY AFFECT RESULT 3.8 3.6  CL 102  --  103  CO2 15*  --  21*  GLUCOSE 98  --  83  BUN 7  --  <5*  CREATININE 0.52  --  0.49  CALCIUM  9.4  --  8.3*    GFR: CrCl cannot be calculated (Unknown ideal weight.).  Liver Function Tests: Recent Labs  Lab 05/05/24 0057 05/06/24 0436  AST 30 21  ALT 28 25  ALKPHOS 79 54  BILITOT 0.2 0.5  PROT 7.5 6.6  ALBUMIN 4.0 2.9*    CBG: No results for input(s): GLUCAP in the last 168 hours.   No results found for this or any previous visit (from the past 240 hours).       Radiology Studies: CT ABDOMEN PELVIS W CONTRAST Result Date: 05/05/2024 CLINICAL DATA:  Abdominal pain EXAM: CT ABDOMEN AND PELVIS WITH CONTRAST TECHNIQUE: Multidetector CT imaging of the abdomen and pelvis was performed using the standard protocol following bolus administration of intravenous contrast. RADIATION DOSE REDUCTION: This exam was performed according to the departmental dose-optimization program which includes automated exposure control, adjustment of the Judy and/or kV according to patient size and/or use of iterative reconstruction technique. CONTRAST:  OMNIPAQUE  IOHEXOL  300 MG/ML  SOLN COMPARISON:  02/24/2024 FINDINGS: Lower chest: Dependent atelectasis in the left  lower lobe. Hepatobiliary: No focal hepatic abnormality. Gallbladder unremarkable. Pancreas: No focal abnormality or ductal dilatation. Inflammation surrounding the pancreatic head and body compatible with acute pancreatitis. Spleen: No focal abnormality.  Normal size. Adrenals/Urinary Tract: No adrenal abnormality. No focal renal abnormality. No stones or hydronephrosis. Urinary bladder is unremarkable. Stomach/Bowel: Normal appendix. Stomach, large and small bowel grossly unremarkable. Vascular/Lymphatic: No evidence of aneurysm or adenopathy. Reproductive: Uterus and adnexa unremarkable.  No mass. Other: Trace free fluid in the pelvis.  No free air. Musculoskeletal: No acute bony abnormality. IMPRESSION: Edema/inflammation surrounding the pancreatic head and body compatible with acute pancreatitis. Electronically Signed   By: Franky Crease M.D.   On: 05/05/2024 04:01        Scheduled Meds:  desogestrel -ethinyl estradiol   1 tablet Oral Daily   enoxaparin  (LOVENOX ) injection  40 mg Subcutaneous Q24H   loratadine   10 mg Oral Daily   sertraline   100 mg Oral Daily   Continuous Infusions:  sodium chloride  100 mL/hr at 05/05/24 2024   cefTRIAXone  (ROCEPHIN )  IV 2 g (05/06/24 0457)     LOS: 1 day    Time  spent: 35 minutes    Toribio Hummer, MD [facility]   To contact the attending provider between 7A-7P or the covering provider during after hours 7P-7A, please log into the web site [email or web address] and access using universal [place] password for that web site. If you do not have the password, please call the hospital operator.  05/06/2024, 10:14 AM

## 2024-05-06 NOTE — Progress Notes (Signed)
 Spoke with Dr. Belinda about pain meds. Pt asking to increase her pain meds. See new orders received.

## 2024-05-06 NOTE — Anesthesia Postprocedure Evaluation (Signed)
 Anesthesia Post Note  Patient: Judy Hughes  Procedure(s) Performed: LAPAROSCOPIC CHOLECYSTECTOMY WITH INTRAOPERATIVE CHOLANGIOGRAM (Abdomen)     Patient location during evaluation: PACU Anesthesia Type: General Level of consciousness: awake and alert Pain management: pain level controlled Vital Signs Assessment: post-procedure vital signs reviewed and stable Respiratory status: spontaneous breathing, nonlabored ventilation, respiratory function stable and patient connected to nasal cannula oxygen Cardiovascular status: blood pressure returned to baseline and stable Postop Assessment: no apparent nausea or vomiting Anesthetic complications: no   No notable events documented.  Last Vitals:  Vitals:   05/06/24 1122 05/06/24 1211  BP: 132/78 133/79  Pulse: 91 94  Resp: 16 16  Temp: 36.7 C 36.6 C  SpO2: 97% 98%    Last Pain:  Vitals:   05/06/24 1211  TempSrc: Oral  PainSc:                  Lynwood MARLA Cornea

## 2024-05-06 NOTE — Anesthesia Preprocedure Evaluation (Signed)
 Anesthesia Evaluation  Patient identified by MRN, date of birth, ID band Patient awake    Reviewed: Allergy & Precautions, NPO status , Patient's Chart, lab work & pertinent test results, reviewed documented beta blocker date and time   History of Anesthesia Complications Negative for: history of anesthetic complications  Airway Mallampati: III  TM Distance: >3 FB Neck ROM: Full    Dental   Pulmonary neg COPD   breath sounds clear to auscultation       Cardiovascular (-) hypertension(-) CAD, (-) Past MI, (-) Cardiac Stents and (-) CABG  Rhythm:Regular Rate:Tachycardia  hyperlipidemia   Neuro/Psych  Headaches, neg Seizures PSYCHIATRIC DISORDERS Anxiety Depression       GI/Hepatic ,GERD  Medicated and Controlled,,(+) neg Cirrhosis      Mild pancreatitis   Endo/Other    Renal/GU Renal disease     Musculoskeletal   Abdominal   Peds  Hematology   Anesthesia Other Findings   Reproductive/Obstetrics                              Anesthesia Physical Anesthesia Plan  ASA: 2  Anesthesia Plan: General   Post-op Pain Management:    Induction: Intravenous and Rapid sequence  PONV Risk Score and Plan: 3 and Ondansetron , Dexamethasone , Propofol  infusion and Droperidol   Airway Management Planned: Oral ETT  Additional Equipment:   Intra-op Plan:   Post-operative Plan: Extubation in OR  Informed Consent: I have reviewed the patients History and Physical, chart, labs and discussed the procedure including the risks, benefits and alternatives for the proposed anesthesia with the patient or authorized representative who has indicated his/her understanding and acceptance.     Dental advisory given  Plan Discussed with: CRNA  Anesthesia Plan Comments:          Anesthesia Quick Evaluation

## 2024-05-06 NOTE — Anesthesia Procedure Notes (Signed)
 Procedure Name: Intubation Date/Time: 05/06/2024 8:03 AM  Performed by: Judythe Tanda Aran, CRNAPre-anesthesia Checklist: Patient identified, Emergency Drugs available, Suction available and Patient being monitored Patient Re-evaluated:Patient Re-evaluated prior to induction Oxygen Delivery Method: Circle system utilized Preoxygenation: Pre-oxygenation with 100% oxygen Induction Type: IV induction, Rapid sequence and Cricoid Pressure applied Laryngoscope Size: 2 and Miller Grade View: Grade I Tube type: Oral Tube size: 7.0 mm Number of attempts: 1 Airway Equipment and Method: Stylet Placement Confirmation: ETT inserted through vocal cords under direct vision, positive ETCO2 and breath sounds checked- equal and bilateral Secured at: 21 cm Tube secured with: Tape Dental Injury: Teeth and Oropharynx as per pre-operative assessment

## 2024-05-06 NOTE — Discharge Instructions (Signed)
 [facility] ______[facility] LAPAROSCOPIC SURGERY: POST OP INSTRUCTIONS Always review your discharge instruction sheet given to you by the facility where your surgery was performed. IF YOU HAVE DISABILITY OR FAMILY LEAVE FORMS, YOU MUST BRING THEM TO THE OFFICE FOR PROCESSING.   DO NOT GIVE THEM TO YOUR DOCTOR.  A prescription for pain medication may be given to you upon discharge.  Take your pain medication as prescribed, if needed.  If narcotic pain medicine is not needed, then you may take acetaminophen  (Tylenol ) or ibuprofen  (Advil ) as needed. Take your usually prescribed medications unless otherwise directed. If you need a refill on your pain medication, please contact your pharmacy.  They will contact our office to request authorization. Prescriptions will not be filled after 5pm or on week-ends. You should follow a light diet the first few days after arrival home, such as soup and crackers, etc.  Be sure to include lots of fluids daily. Most patients will experience some swelling and bruising in the area of the incisions.  Ice packs will help.  Swelling and bruising can take several days to resolve.  It is common to experience some constipation if taking pain medication after surgery.  Increasing fluid intake and taking a stool softener (such as Colace) will usually help or prevent this problem from occurring.  A mild laxative (Milk of Magnesia or Miralax) should be taken according to package instructions if there are no bowel movements after 48 hours. Unless discharge instructions indicate otherwise, you may remove your bandages 24-48 hours after surgery, and you may shower at that time.  You may have steri-strips (small skin tapes) in place directly over the incision.  These strips should be left on the skin for 7-10 days.  If your surgeon used skin glue on the incision, you may shower in 24 hours.  The glue will flake off over the next 2-3 weeks.  Any sutures or staples will be  removed at the office during your follow-up visit. ACTIVITIES:  You may resume regular (light) daily activities beginning the next day--such as daily self-care, walking, climbing stairs--gradually increasing activities as tolerated.  You may have sexual intercourse when it is comfortable.  Refrain from any heavy lifting or straining until approved by your doctor. You may drive when you are no longer taking prescription pain medication, you can comfortably wear a seatbelt, and you can safely maneuver your car and apply brakes. RETURN TO WORK:  __________________________________________________________ Judy Hughes should see your doctor in the office for a follow-up appointment approximately 2-3 weeks after your surgery.  Make sure that you call for this appointment within a day or two after you arrive home to insure a convenient appointment time. OTHER INSTRUCTIONS: __________________________________________________________________________________________________________________________ __________________________________________________________________________________________________________________________ WHEN TO CALL YOUR DOCTOR: Fever over 101.0 Inability to urinate Continued bleeding from incision. Increased pain, redness, or drainage from the incision. Increasing abdominal pain  The clinic staff is available to answer your questions during regular business hours.  Please don't hesitate to call and ask to speak to one of the nurses for clinical concerns.  If you have a medical emergency, go to the nearest emergency room or call 911.  A surgeon from [facility] is always on call at the hospital. [address], [address], [place], [place]  [address] ? [address] [address], [place], [place]   [address] [phone] ? [phone] ? FAX [phone] Web site: [email or web address]

## 2024-05-06 NOTE — Op Note (Signed)
 Laparoscopic Cholecystectomy with IOC Procedure Note  Indications: This is a 18 year old female with a history of PCOS, migraine headaches, hyperlipidemia who presents after a recent hospitalization for pancreatitis.  Imaging showed no sign of bile duct dilatation or gallstones.  On 03/28/2024, she underwent an MRCP that showed some gallbladder sludge with no sign of choledocholithiasis or obstruction.  She was having minimal symptoms.  She was seen on 04/24/2024 by Dr. Ebbie who planned an elective cholecystectomy.  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  She presented to the emergency department earlier this morning with diffuse upper abdominal pain.  She was noted to have a mild elevation of her lipase into the 90s.  Her pain is located mostly in her epigastrium with radiation towards the left upper quadrant as well as the right upper quadrant.  CT scan showed some inflammation surround the pancreatic head and body consistent with acute pancreatitis.  No radiologic signs of acute cholecystitis.  She was transferred to [facility] for [facility] treatment.  She is being admitted by the hospitalist.  We are consulted to discuss possible cholecystectomy.   Pre-operative Diagnosis: Biliary pancreatitis/ chronic cholecystitis  Post-operative Diagnosis: Same  Surgeon: [name]   Assistants: None  Anesthesia: General endotracheal anesthesia  ASA Class: 1  Procedure Details  The patient was seen again in the Holding Room. The risks, benefits, complications, treatment options, and expected outcomes were discussed with the patient. The possibilities of reaction to medication, pulmonary aspiration, perforation of viscus, bleeding, recurrent infection, finding a normal gallbladder, the need for additional procedures, failure to diagnose a condition, the possible need to convert to an open procedure, and creating a complication requiring transfusion or operation were  discussed with the patient. The likelihood of improving the patient's symptoms with return to their baseline status is good.  The patient and/or family concurred with the proposed plan, giving informed consent. The site of surgery properly noted. The patient was taken to Operating Room, identified as Jozey Spinney and the procedure verified as Laparoscopic Cholecystectomy with Intraoperative Cholangiogram. A Time Out was held and the above information confirmed.  Prior to the induction of general anesthesia, antibiotic prophylaxis was administered. General endotracheal anesthesia was then administered and tolerated well. After the induction, the abdomen was prepped with Chloraprep and draped in the sterile fashion. The patient was positioned in the supine position.  Local anesthetic agent was injected into the skin below the umbilicus and an incision made. We dissected down to the abdominal fascia with blunt dissection.  The fascia was incised vertically and we entered the peritoneal cavity bluntly.  A pursestring suture of 0-Vicryl was placed around the fascial opening.  The Hasson cannula was inserted and secured with the stay suture.  Pneumoperitoneum was then created with CO2 and tolerated well without any adverse changes in the patient's vital signs. An 11-mm port was placed in the subxiphoid position.  Two 5-mm ports were placed in the right upper quadrant. All skin incisions were infiltrated with a local anesthetic agent before making the incision and placing the trocars.   We positioned the patient in reverse Trendelenburg, tilted slightly to the patient's left.  The gallbladder was identified, the fundus grasped and retracted cephalad. There are some adhesions to the liver edge and the gallbladder fundus.  Adhesions were lysed bluntly and with the electrocautery where indicated, taking care not to injure any adjacent organs or viscus. The infundibulum was grasped and retracted  laterally, exposing the  peritoneum overlying the triangle of Calot. This was then divided and exposed in a blunt fashion. A critical view of the cystic duct and cystic artery was obtained.  The cystic duct was clearly identified and bluntly dissected circumferentially. The cystic duct was ligated with a clip distally.   An incision was made in the cystic duct and the [facility] cholangiogram catheter introduced. The catheter was secured using a clip. A cholangiogram was then obtained which showed good visualization of the distal and proximal biliary tree with no sign of obstruction.  Initially, there was a possible filling defect in the proximal biliary tree, but this resolved on the second fluoroscopic run, so this may have represented an air bubble.  Contrast flowed easily into the duodenum. The catheter was then removed.   The cystic duct was then ligated with clips and divided. The cystic artery was identified, dissected free, ligated with clips and divided as well.   The gallbladder was dissected from the liver bed in retrograde fashion with the electrocautery.  Some bile was spilled, but no stones were noted. The gallbladder was removed and placed in a retrieval sac. The liver bed was irrigated and inspected. Hemostasis was achieved with the electrocautery. Copious irrigation was utilized and was repeatedly aspirated until clear.  The gallbladder and retrieval sac were then removed through the umbilical port site.  The pursestring suture was used to close the umbilical fascia.    We again inspected the right upper quadrant for hemostasis.  Pneumoperitoneum was released as we removed the trocars.  4-0 Monocryl was used to close the skin.   Benzoin, steri-strips, and clean dressings were applied. The patient was then extubated and brought to the recovery room in stable condition. Instrument, sponge, and needle counts were correct at closure and at the conclusion of the case.   Findings: Chronic cholecystitis without  Cholelithiasis  Estimated Blood Loss: Minimal         Drains: none         Specimens: Gallbladder           Complications: None; patient tolerated the procedure well.         Disposition: PACU - hemodynamically stable.         Condition: stable  Donnice POUR. Belinda, MD, [facility]  General Surgery   05/06/2024 9:29 AM

## 2024-05-06 NOTE — Interval H&P Note (Signed)
 History and Physical Interval Note:  05/06/2024 7:18 AM  Judy Hughes  has presented today for surgery, with the diagnosis of BILIARY PANCREATITIS.  The various methods of treatment have been discussed with the patient and family. After consideration of risks, benefits and other options for treatment, the patient has consented to  Procedure(s): LAPAROSCOPIC CHOLECYSTECTOMY WITH INTRAOPERATIVE CHOLANGIOGRAM (N/A) as a surgical intervention.  The patient's history has been reviewed, patient examined, no change in status, stable for surgery.  I have reviewed the patient's chart and labs.  Questions were answered to the patient's satisfaction.     [name]

## 2024-05-06 NOTE — Plan of Care (Signed)
   Problem: Activity: Goal: Risk for activity intolerance will decrease Outcome: Progressing   Problem: Nutrition: Goal: Adequate nutrition will be maintained Outcome: Progressing   Problem: Pain Managment: Goal: General experience of comfort will improve and/or be controlled Outcome: Progressing   Problem: Safety: Goal: Ability to remain free from injury will improve Outcome: Progressing

## 2024-05-06 NOTE — Transfer of Care (Signed)
 Immediate Anesthesia Transfer of Care Note  Patient: Judy Hughes  Procedure(s) Performed: LAPAROSCOPIC CHOLECYSTECTOMY WITH INTRAOPERATIVE CHOLANGIOGRAM (Abdomen)  Patient Location: PACU  Anesthesia Type:General  Level of Consciousness: awake, alert , oriented, and patient cooperative  Airway & Oxygen Therapy: Patient Spontanous Breathing  Post-op Assessment: Report given to RN and Post -op Vital signs reviewed and stable  Post vital signs: Reviewed and stable  Last Vitals:  Vitals Value Taken Time  BP    Temp    Pulse 113 05/06/24 09:23  Resp 17 05/06/24 09:23  SpO2 95 % 05/06/24 09:23  Vitals shown include unfiled device data.  Last Pain:  Vitals:   05/06/24 0601  TempSrc: Oral  PainSc:          Complications: No notable events documented.

## 2024-05-07 ENCOUNTER — Other Ambulatory Visit (HOSPITAL_COMMUNITY): Payer: Self-pay

## 2024-05-07 ENCOUNTER — Encounter (HOSPITAL_COMMUNITY): Payer: Self-pay | Admitting: Surgery

## 2024-05-07 LAB — CBC
HCT: 30.3 % — ABNORMAL LOW (ref 36.0–46.0)
Hemoglobin: 9.6 g/dL — ABNORMAL LOW (ref 12.0–15.0)
MCH: 27.7 pg (ref 26.0–34.0)
MCHC: 31.7 g/dL (ref 30.0–36.0)
MCV: 87.3 fL (ref 80.0–100.0)
Platelets: 323 K/uL (ref 150–400)
RBC: 3.47 MIL/uL — ABNORMAL LOW (ref 3.87–5.11)
RDW: 13.2 % (ref 11.5–15.5)
WBC: 12.9 K/uL — ABNORMAL HIGH (ref 4.0–10.5)
nRBC: 0 % (ref 0.0–0.2)

## 2024-05-07 LAB — COMPREHENSIVE METABOLIC PANEL WITH GFR
ALT: 25 U/L (ref 0–44)
AST: 23 U/L (ref 15–41)
Albumin: 2.6 g/dL — ABNORMAL LOW (ref 3.5–5.0)
Alkaline Phosphatase: 48 U/L (ref 38–126)
Anion gap: 10 (ref 5–15)
BUN: 5 mg/dL — ABNORMAL LOW (ref 6–20)
CO2: 21 mmol/L — ABNORMAL LOW (ref 22–32)
Calcium: 8.6 mg/dL — ABNORMAL LOW (ref 8.9–10.3)
Chloride: 103 mmol/L (ref 98–111)
Creatinine, Ser: 0.34 mg/dL — ABNORMAL LOW (ref 0.44–1.00)
GFR, Estimated: >60 mL/min (ref >60)
Glucose, Bld: 97 mg/dL (ref 70–99)
Potassium: 4 mmol/L (ref 3.5–5.1)
Sodium: 134 mmol/L — ABNORMAL LOW (ref 135–145)
Total Bilirubin: 0.6 mg/dL (ref 0.0–1.2)
Total Protein: 6.2 g/dL — ABNORMAL LOW (ref 6.5–8.1)

## 2024-05-07 LAB — LIPASE, BLOOD: Lipase: 34 U/L (ref 11–51)

## 2024-05-07 MED ORDER — IBUPROFEN 200 MG PO TABS: 800.0000 mg | ORAL_TABLET | Freq: Three times a day (TID) | ORAL | Status: AC | PRN

## 2024-05-07 MED ORDER — METHOCARBAMOL 500 MG PO TABS
500.0000 mg | ORAL_TABLET | Freq: Three times a day (TID) | ORAL | 0 refills | Status: AC | PRN
  Filled 2024-05-07: qty 50, 17d supply, fill #0

## 2024-05-07 MED ORDER — ACETAMINOPHEN 500 MG PO TABS
1000.0000 mg | ORAL_TABLET | Freq: Four times a day (QID) | ORAL | Status: DC
  Administered 2024-05-07: 1000 mg via ORAL
  Filled 2024-05-07: qty 2

## 2024-05-07 MED ORDER — OXYCODONE HCL 5 MG PO TABS
5.0000 mg | ORAL_TABLET | Freq: Four times a day (QID) | ORAL | 0 refills | Status: AC | PRN
  Filled 2024-05-07: qty 30, 8d supply, fill #0
  Filled 2024-05-07: qty 30, 5d supply, fill #0

## 2024-05-07 MED ORDER — METHOCARBAMOL 500 MG PO TABS: 500.0000 mg | ORAL_TABLET | Freq: Three times a day (TID) | ORAL | Status: DC | PRN

## 2024-05-07 MED ORDER — ACETAMINOPHEN 500 MG PO TABS: 1000.0000 mg | ORAL_TABLET | Freq: Four times a day (QID) | ORAL | Status: AC | PRN

## 2024-05-07 NOTE — Progress Notes (Signed)
   05/07/24 1019  TOC Brief Assessment  Insurance and Status Reviewed  Patient has primary care physician Yes  Home environment has been reviewed home w/ roommates  Prior level of function: independent  Prior/Current Home Services No current home services  Social Drivers of Health Review SDOH reviewed no interventions necessary  Readmission risk has been reviewed Yes  Transition of care needs no transition of care needs at this time

## 2024-05-07 NOTE — Plan of Care (Signed)

## 2024-05-07 NOTE — Discharge Summary (Signed)
 Patient IDBrittan Hughes [id number] Mar 09, 2006 18 y.o.  Admit date: 05/05/2024 Discharge date: 05/07/2024  Admitting Diagnosis: Gallstone pancreatitis  Discharge Diagnosis Patient Active Problem List   Diagnosis Date Noted   Chronic cholecystitis 05/06/2024   Pleural effusion on left 03/01/2024   Acute pancreatitis 02/24/2024   Adjustment disorder with mixed anxiety and depressed mood 05/09/2023   Major depressive disorder, single episode, unspecified 10/14/2021   Dorsalgia, unspecified 10/14/2021   Anxiety disorder, unspecified 08/20/2021   Attention-deficit hyperactivity disorder, unspecified type 08/20/2021   Mixed hyperlipidemia 02/04/2020   Menorrhagia with regular cycle 09/25/2019   Stress due to family tension 08/16/2017   Alteration in family processes 08/16/2017   Custody issue 08/16/2017   Family disruption 08/16/2017   Nausea without vomiting 08/16/2017   BMI (body mass index), pediatric, 95-99% for age 77/02/2017  S/p lap chole  Consultants none  Reason for Admission: This is a 18 year old female with a history of PCOS, migraine headaches, hyperlipidemia who presents after a recent hospitalization for pancreatitis.  Imaging showed no sign of bile duct dilatation or gallstones.  On 03/28/2024, she underwent an MRCP that showed some gallbladder sludge with no sign of choledocholithiasis or obstruction.  She was having minimal symptoms.  She was seen on 04/24/2024 by Dr. Ebbie who planned an elective cholecystectomy.  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  She presented to the emergency department earlier this morning with diffuse upper abdominal pain.  She was noted to have a mild elevation of her lipase into the 90s.  Her pain is located mostly in her epigastrium with radiation towards the left upper quadrant as well as the right upper quadrant.  CT scan showed some inflammation surround the pancreatic head and body consistent  with acute pancreatitis.  No radiologic signs of acute cholecystitis.  She was transferred to [facility] for further treatment.  She is being admitted by the hospitalist.  We are consulted to discuss possible cholecystectomy.   Procedures Lap chole by Dr. Belinda 05/06/2024  Hospital Course:  The patient was admitted and underwent a laparoscopic cholecystectomy with IOC.  The patient tolerated the procedure well.  On POD 1, the patient was tolerating a regular diet, voiding well, mobilizing, and pain was controlled with oral pain medications.  The patient was stable for DC home at this time with appropriate follow up made.   Physical Exam: Abd: soft, appropriately tender, +BS, incisions c/d/I with gauze and tegaderm present  Allergies as of 05/07/2024       Reactions   Sulfa Antibiotics Rash        Medication List     STOP taking these medications    fenofibrate  160 MG tablet   MULTIVITAMIN PO       TAKE these medications    acetaminophen  500 MG tablet Commonly known as: TYLENOL  Take 2 tablets (1,000 mg total) by mouth every 6 (six) hours as needed.   amphetamine -dextroamphetamine  10 MG 24 hr capsule Commonly known as: Adderall XR Take 1 capsule (10 mg total) by mouth daily.   cetirizine  10 MG tablet Commonly known as: ZYRTEC  Take 10 mg by mouth daily.   ibuprofen  200 MG tablet Commonly known as: ADVIL  Take 4 tablets (800 mg total) by mouth every 8 (eight) hours as needed.   Isibloom  0.15-30 MG-MCG tablet Generic drug: desogestrel -ethinyl estradiol  Take 1 tablet by mouth daily. (continuous)   methocarbamol  500 MG tablet Commonly known as: ROBAXIN  Take  1 tablet (500 mg total) by mouth every 8 (eight) hours as needed for muscle spasms.   ondansetron  4 MG disintegrating tablet Commonly known as: ZOFRAN -ODT Dissolve 1 tablet (4 mg total) in mouth every 8 (eight) hours as needed for nausea or vomiting.   oxyCODONE  5 MG immediate release tablet Commonly known as:  Oxy IR/ROXICODONE  Take 1 tablet (5 mg total) by mouth every 6 (six) hours as needed for moderate pain (pain score 4-6).   rosuvastatin  20 MG tablet Commonly known as: CRESTOR  Take 1 tablet (20 mg total) by mouth daily.   sertraline  100 MG tablet Commonly known as: ZOLOFT  Take 1 tablet (100 mg total) by mouth daily.          Follow-up Information     [facility] Follow up in 3 week(s).   Specialty: General Surgery Why: You will receive a call from our office to schedule a follow-up appointment in 2-3 weeks. Contact information: [address] [place]  [address] [phone]                Signed: Burnard Banter, [facility] 05/07/2024, 11:34 AM Please see Amion for pager number during day hours 7:00am-4:30pm, 7-11:30am on Weekends

## 2024-05-07 NOTE — Progress Notes (Signed)
 Discharge meds in a secure bag delivered to pt in room by this RN- AVS reviewed w/ pt and mom who verbalized an understanding. PIV removed as noted. Pt dressing for d/c to home- pt provided w/ excuse letters for school

## 2024-05-08 ENCOUNTER — Telehealth: Payer: Self-pay | Admitting: Pediatrics

## 2024-05-08 LAB — SURGICAL PATHOLOGY

## 2024-05-08 NOTE — Telephone Encounter (Signed)
 Received fax regarding [employer] program. Faxed all information, received SUCCESS, and filed in 19th dated folder

## 2024-05-11 ENCOUNTER — Ambulatory Visit (INDEPENDENT_AMBULATORY_CARE_PROVIDER_SITE_OTHER): Admitting: Pediatrics

## 2024-05-11 ENCOUNTER — Other Ambulatory Visit (HOSPITAL_COMMUNITY): Payer: Self-pay

## 2024-05-11 VITALS — BP 110/72 | Ht 63.5 in | Wt 197.3 lb

## 2024-05-11 DIAGNOSIS — Z23 Encounter for immunization: Secondary | ICD-10-CM

## 2024-05-11 DIAGNOSIS — E611 Iron deficiency: Secondary | ICD-10-CM | POA: Diagnosis not present

## 2024-05-11 DIAGNOSIS — F900 Attention-deficit hyperactivity disorder, predominantly inattentive type: Secondary | ICD-10-CM | POA: Diagnosis not present

## 2024-05-11 DIAGNOSIS — Z6834 Body mass index (BMI) 34.0-34.9, adult: Secondary | ICD-10-CM

## 2024-05-11 DIAGNOSIS — N920 Excessive and frequent menstruation with regular cycle: Secondary | ICD-10-CM | POA: Diagnosis not present

## 2024-05-11 DIAGNOSIS — E282 Polycystic ovarian syndrome: Secondary | ICD-10-CM

## 2024-05-11 DIAGNOSIS — Z0001 Encounter for general adult medical examination with abnormal findings: Secondary | ICD-10-CM

## 2024-05-11 DIAGNOSIS — Z0000 Encounter for general adult medical examination without abnormal findings: Secondary | ICD-10-CM

## 2024-05-11 DIAGNOSIS — Z1339 Encounter for screening examination for other mental health and behavioral disorders: Secondary | ICD-10-CM | POA: Diagnosis not present

## 2024-05-11 MED ORDER — ISIBLOOM 0.15-30 MG-MCG PO TABS
1.0000 | ORAL_TABLET | Freq: Every day | ORAL | 3 refills | Status: AC
  Filled 2024-05-11: qty 84, 84d supply, fill #0
  Filled 2024-07-13 – 2024-10-09 (×2): qty 112, 84d supply, fill #0
  Filled 2024-10-09: qty 112, 84d supply, fill #1

## 2024-05-11 MED ORDER — ONDANSETRON 4 MG PO TBDP
4.0000 mg | ORAL_TABLET | Freq: Three times a day (TID) | ORAL | 2 refills | Status: AC | PRN
  Filled 2024-05-11: qty 20, 7d supply, fill #0

## 2024-05-11 MED ORDER — AMPHETAMINE-DEXTROAMPHET ER 10 MG PO CP24
10.0000 mg | ORAL_CAPSULE | Freq: Every day | ORAL | 0 refills | Status: AC
  Filled 2024-05-11: qty 90, 90d supply, fill #0

## 2024-05-11 NOTE — Patient Instructions (Addendum)
 At [facility] we value your feedback. You may receive a survey about your visit today. Please share your experience as we strive to create trusting relationships with our patients to provide genuine, compassionate, quality care.   Preventive Care 43-19 Years Old, Female Preventive care refers to lifestyle choices and visits with your health care provider that can promote health and wellness. At this stage in your life, you may start seeing a primary care physician instead of a pediatrician for your preventive care. Preventive care visits are also called wellness exams. What can I expect for my preventive care visit? Counseling During your preventive care visit, your health care provider may ask about your: Medical history, including: Past medical problems. Family medical history. Pregnancy history. Current health, including: Menstrual cycle. Method of birth control. Emotional well-being. Home life and relationship well-being. Sexual activity and sexual health. Lifestyle, including: Alcohol, nicotine or tobacco, and drug use. Access to firearms. Diet, exercise, and sleep habits. Sunscreen use. Motor vehicle safety. Physical exam Your health care provider may check your: Height and weight. These may be used to calculate your BMI (body mass index). BMI is a measurement that tells if you are at a healthy weight. Waist circumference. This measures the distance around your waistline. This measurement also tells if you are at a healthy weight and may help predict your risk of certain diseases, such as type 2 diabetes and high blood pressure. Heart rate and blood pressure. Body temperature. Skin for abnormal spots. Breasts. What immunizations do I need? Vaccines are usually given at various ages, according to a schedule. Your health care provider will recommend vaccines for you based on your age, medical history, and lifestyle or other factors, such as travel or where you  work. What tests do I need? Screening Your health care provider may recommend screening tests for certain conditions. This may include: Vision and hearing tests. Lipid and cholesterol levels. Pelvic exam and Pap test. Hepatitis B test. Hepatitis C test. HIV (human immunodeficiency virus) test. STI (sexually transmitted infection) testing, if you are at risk. Tuberculosis skin test if you have symptoms. BRCA-related cancer screening. This may be done if you have a family history of breast, ovarian, tubal, or peritoneal cancers. Talk with your health care provider about your test results, treatment options, and if necessary, the need for more tests. Follow these instructions at home: Eating and drinking Eat a healthy diet that includes fresh fruits and vegetables, whole grains, lean protein, and low-fat dairy products. Drink enough fluid to keep your urine pale yellow. Do not drink alcohol if: Your health care provider tells you not to drink. You are pregnant, may be pregnant, or are planning to become pregnant. You are under the legal drinking age. In the [place] the legal drinking age is 40. If you drink alcohol: Limit how much you have to 0-1 drink a day. Know how much alcohol is in your drink. In the [place] one drink equals one 12 oz bottle of beer (355 mL), one 5 oz glass of wine (148 mL), or one 1 oz glass of hard liquor (44 mL). Lifestyle Brush your teeth every morning and night with fluoride toothpaste. Floss one time each day. Exercise for at least 30 minutes 5 or more days of the week. Do not use any products that contain nicotine or tobacco. These products include cigarettes, chewing tobacco, and vaping devices, such as e-cigarettes. If you need help quitting, ask your health care provider. Do not use drugs. If you are  sexually active, practice safe sex. Use a condom or other form of protection to prevent STIs. If you do not wish to become pregnant, use a form of birth control.  If you plan to become pregnant, see your health care provider for a prepregnancy visit. Find healthy ways to manage stress, such as: Meditation, yoga, or listening to music. Journaling. Talking to a trusted person. Spending time with friends and family. Safety Always wear your seat belt while driving or riding in a vehicle. Do not drive: If you have been drinking alcohol. Do not ride with someone who has been drinking. When you are tired or distracted. While texting. If you have been using any mind-altering substances or drugs. Wear a helmet and other protective equipment during sports activities. If you have firearms in your house, make sure you follow all gun safety procedures. Seek help if you have been bullied, physically abused, or sexually abused. Use the internet responsibly to avoid dangers, such as online bullying and online sex predators. What's next? Go to your health care provider once a year for an annual wellness visit. Ask your health care provider how often you should have your eyes and teeth checked. Stay up to date on all vaccines. This information is not intended to replace advice given to you by your health care provider. Make sure you discuss any questions you have with your health care provider. Document Revised: 03/04/2021 Document Reviewed: 03/04/2021 Elsevier Patient Education  2024 [employer].

## 2024-05-11 NOTE — Progress Notes (Signed)
 Subjective:     Judy Hughes is a 18 y.o. female and is here for a comprehensive physical exam. The patient reports no problems. Recovering well from surgery 3 days ago.   Social History   Socioeconomic History   Marital status: Single    Spouse name: Not on file   Number of children: Not on file   Years of education: Not on file   Highest education level: Not on file  Occupational History   Not on file  Tobacco Use   Smoking status: Never    Passive exposure: Current   Smokeless tobacco: Never   Tobacco comments:    second hand smoke exposure  Vaping Use   Vaping status: Never Used  Substance and Sexual Activity   Alcohol use: No   Drug use: No   Sexual activity: Never  Other Topics Concern   Not on file  Social History Narrative   Lives with mother.   Part time with father and 2 half brothers there.  Hobbies - dance, singing      Fall 2024- freshman at [employer]: Not on [employer] Insecurity: No Food Insecurity (05/05/2024)   Hunger Vital Sign    Worried About Running Out of Food in the Last Year: Never true    Ran Out of Food in the Last Year: Never true  Transportation Needs: No Transportation Needs (05/05/2024)   PRAPARE - Administrator, Civil Service (Medical): No    Lack of Transportation (Non-Medical): No  Physical Activity: Not on file  Stress: Not on file  Social Connections: Not on file  Intimate Partner Violence: Not At Risk (05/05/2024)   Humiliation, Afraid, Rape, and Kick questionnaire    Fear of Current or Ex-Partner: No    Emotionally Abused: No    Physically Abused: No    Sexually Abused: No   Health Maintenance  Topic Date Due   COVID-19 Vaccine (1) Never done   Hepatitis C Screening  Never done   INFLUENZA VACCINE  04/20/2024   DTaP/Tdap/Td (7 - Td or Tdap) 04/15/2026   Pneumococcal Vaccine  Completed   Hepatitis B Vaccines 19-59 Average Risk  Completed   HPV VACCINES   Completed   HIV Screening  Completed   Meningococcal B Vaccine  Completed    The following portions of the patient's history were reviewed and updated as appropriate: allergies, current medications, past family history, past medical history, past social history, past surgical history, and problem list.  Review of Systems Pertinent items are noted in HPI.   Objective:    BP 110/72   Ht 5' 3.5 (1.613 m)   Wt 197 lb 5 oz (89.5 kg)   BMI 34.40 kg/m  General appearance: alert, cooperative, appears stated age, and no distress Head: Normocephalic, without obvious abnormality, atraumatic Eyes: conjunctivae/corneas clear. PERRL, EOM's intact. Fundi benign. Ears: normal TM's and external ear canals both ears Nose: Nares normal. Septum midline. Mucosa normal. No drainage or sinus tenderness. Throat: lips, mucosa, and tongue normal; teeth and gums normal Neck: no adenopathy, no carotid bruit, no JVD, supple, symmetrical, trachea midline, and thyroid  not enlarged, symmetric, no tenderness/mass/nodules Lungs: clear to auscultation bilaterally Heart: regular rate and rhythm, S1, S2 normal, no murmur, click, rub or gallop and normal apical impulse Abdomen: soft, non-tender; bowel sounds normal; no masses,  no organomegaly and surgical incisions healing well Extremities: extremities normal, atraumatic, no cyanosis or edema Pulses:  2+ and symmetric Skin: Skin color, texture, turgor normal. No rashes or lesions Lymph nodes: Cervical, supraclavicular, and axillary nodes normal. Neurologic: Grossly normal    Results for orders placed or performed in visit on 05/11/24 (from the past 48 hours)  POCT hemoglobin     Status: Normal   Collection Time: 05/11/24  4:00 PM  Result Value Ref Range   Hemoglobin 12.6 11 - 14.6 g/dL    Assessment:    Healthy female exam.     Iron deficiency Plan:   MenB vaccine per orders. Indications, contraindications and side effects of vaccine/vaccines discussed with  parent and parent verbally expressed understanding and also agreed with the administration of vaccine/vaccines as ordered above today.Handout (VIS) given for each vaccine at this visit. Refilled ADHD medication, OCP, and ondansetron   See After Visit Summary for Counseling Recommendations

## 2024-05-12 ENCOUNTER — Encounter: Payer: Self-pay | Admitting: Pediatrics

## 2024-05-12 DIAGNOSIS — E611 Iron deficiency: Secondary | ICD-10-CM | POA: Insufficient documentation

## 2024-05-12 DIAGNOSIS — Z6834 Body mass index (BMI) 34.0-34.9, adult: Secondary | ICD-10-CM | POA: Insufficient documentation

## 2024-05-12 LAB — POCT HEMOGLOBIN: Hemoglobin: 12.6 g/dL (ref 11–14.6)

## 2024-07-13 ENCOUNTER — Other Ambulatory Visit (HOSPITAL_COMMUNITY): Payer: Self-pay

## 2024-10-09 ENCOUNTER — Other Ambulatory Visit (HOSPITAL_COMMUNITY): Payer: Self-pay

## 2024-10-09 ENCOUNTER — Other Ambulatory Visit: Payer: Self-pay
